# Patient Record
Sex: Male | Born: 1970 | Race: White | Hispanic: No | Marital: Married | Smoking: Former smoker
Health system: Southern US, Community
[De-identification: ages and names within clinical notes are randomized; demographics above are authoritative.]

## PROBLEM LIST (undated history)

## (undated) DIAGNOSIS — I10 Essential (primary) hypertension: Secondary | ICD-10-CM

## (undated) DIAGNOSIS — F329 Major depressive disorder, single episode, unspecified: Secondary | ICD-10-CM

## (undated) DIAGNOSIS — N200 Calculus of kidney: Secondary | ICD-10-CM

## (undated) DIAGNOSIS — F32A Depression, unspecified: Secondary | ICD-10-CM

## (undated) HISTORY — PX: THYROIDECTOMY, PARTIAL: SHX18

## (undated) HISTORY — PX: EYE SURGERY: SHX253

## (undated) HISTORY — DX: Depression, unspecified: F32.A

## (undated) HISTORY — DX: Major depressive disorder, single episode, unspecified: F32.9

---

## 2000-09-01 ENCOUNTER — Emergency Department (HOSPITAL_COMMUNITY): Admission: EM | Admit: 2000-09-01 | Discharge: 2000-09-01 | Payer: Self-pay | Admitting: Emergency Medicine

## 2000-09-01 ENCOUNTER — Encounter: Payer: Self-pay | Admitting: Emergency Medicine

## 2001-06-09 ENCOUNTER — Emergency Department (HOSPITAL_COMMUNITY): Admission: EM | Admit: 2001-06-09 | Discharge: 2001-06-09 | Payer: Self-pay | Admitting: *Deleted

## 2001-06-16 ENCOUNTER — Encounter: Payer: Self-pay | Admitting: Emergency Medicine

## 2001-06-16 ENCOUNTER — Emergency Department (HOSPITAL_COMMUNITY): Admission: EM | Admit: 2001-06-16 | Discharge: 2001-06-16 | Payer: Self-pay | Admitting: Emergency Medicine

## 2003-06-29 ENCOUNTER — Inpatient Hospital Stay (HOSPITAL_COMMUNITY): Admission: EM | Admit: 2003-06-29 | Discharge: 2003-06-30 | Payer: Self-pay | Admitting: Emergency Medicine

## 2003-06-29 ENCOUNTER — Encounter: Payer: Self-pay | Admitting: Cardiology

## 2005-09-24 ENCOUNTER — Emergency Department (HOSPITAL_COMMUNITY): Admission: EM | Admit: 2005-09-24 | Discharge: 2005-09-24 | Payer: Self-pay | Admitting: *Deleted

## 2007-02-22 ENCOUNTER — Emergency Department (HOSPITAL_COMMUNITY): Admission: EM | Admit: 2007-02-22 | Discharge: 2007-02-22 | Payer: Self-pay | Admitting: *Deleted

## 2010-01-19 ENCOUNTER — Emergency Department (HOSPITAL_COMMUNITY): Admission: EM | Admit: 2010-01-19 | Discharge: 2010-01-19 | Payer: Self-pay | Admitting: Emergency Medicine

## 2010-07-18 ENCOUNTER — Encounter: Admission: RE | Admit: 2010-07-18 | Discharge: 2010-07-18 | Payer: Self-pay | Admitting: Family Medicine

## 2011-01-07 LAB — CBC
HCT: 38.5 % — ABNORMAL LOW (ref 39.0–52.0)
Hemoglobin: 13.4 g/dL (ref 13.0–17.0)
MCHC: 34.7 g/dL (ref 30.0–36.0)
MCV: 91.8 fL (ref 78.0–100.0)
Platelets: 219 10*3/uL (ref 150–400)
RDW: 13.2 % (ref 11.5–15.5)

## 2011-01-07 LAB — DIFFERENTIAL
Basophils Absolute: 0 10*3/uL (ref 0.0–0.1)
Basophils Relative: 0 % (ref 0–1)
Eosinophils Absolute: 0.2 10*3/uL (ref 0.0–0.7)
Eosinophils Relative: 2 % (ref 0–5)
Monocytes Absolute: 0.5 10*3/uL (ref 0.1–1.0)

## 2011-01-07 LAB — PROTIME-INR: Prothrombin Time: 13.3 seconds (ref 11.6–15.2)

## 2011-03-06 NOTE — Discharge Summary (Signed)
NAME:  FOSTER, FRERICKS                          ACCOUNT NO.:  [id number]   MEDICAL RECORD NO.:  [id number]                   PATIENT TYPE:  INP   LOCATION:  4707                                 FACILITY:  [facility]   PHYSICIAN:  Learta Codding, M.D.                 DATE OF BIRTH:  1971/07/12   DATE OF ADMISSION:  06/29/2003  DATE OF DISCHARGE:                           DISCHARGE SUMMARY - REFERRING   ADMITTING PHYSICIAN:  Dr. Vernie Shanks. DeGent.   DISCHARGE PHYSICIAN:  Dr. Duke Salvia.   SUMMARY OF HISTORY:  Mr. Mckiver is a 40 year old white male who presents with  two days of chest discomfort.  He stated that while he was at work, on  June 27, 2003, he had chest pressure which he rated as a 6-7 on a scale  of 0-10, unassociated with any symptoms.  The discomfort only lasted 1-2  minutes and resolved only to reoccur later.  He went to see Dr. Andrey Campanile for  the discomfort, who referred the patient to the ER for further evaluation  and EKG changes.  He states the discomfort is similar to chest pain he has  had in the past which was diagnosed as a pulled muscle.   MEDICAL HISTORY:  Essentially unremarkable except that he smokes two packs  per day.   LABORATORY DATA:  Admission H&H were 14.5 and 42.3, normal indices,  platelets 184, WBCs 8.2.  Sodium 140, potassium 3.3, BUN 11, creatinine 0.8,  glucose 89.  Normal LFTs.  CK total, MB were negative for myocardial  infarction.  Fasting lipids showed a total cholesterol 125, triglycerides  198, HDL 35, LDL 50.  TSH was 2.788.  Urinalysis drug screen unremarkable.   EKG showed sinus bradycardia, normal sinus rhythm, early repolarization.   HOSPITAL COURSE:  Mr. Farruggia was admitted to the unit 4700.  Placed on  aspirin, Protonix and sublingual nitroglycerin paste.  Overnight he has not  had any further symptoms.  Enzymes and EKGs were negative for myocardial  infarction.   After review, Dr. Graciela Husbands felt that he could be discharged for  arrangements to  have an outpatient stress Cardiolite.  The patient was encouraged to  continue cardiac risk factor modification, especially to discontinue smoking  as both of his parents died secondary to lung cancer at an early age.  The  information to perform an outpatient stress Cardiolite has been faxed to our  office.   DISCHARGE DIAGNOSES:  Atypical chest discomfort.    DISPOSITION:  He is discharged home after we gave him aspirin 325 mg every  day.  Maintain a low salt-fat-cholesterol diet.  He is advised no smoking or  tobacco products.  Limit his beer intake to two beers per day or 2 ounces of  alcohol.  He was instructed that our office will call him at home to arrange  an outpatient stress Cardiolite and follow with the  PA.       Joellyn Rued, P.A. [facility]                    Learta Codding, M.D.    EW/MEDQ  D:  06/30/2003  T:  06/30/2003  Job:  [id number]   cc:   [place]ale [place]. Andrey Campanile, M.D.  [address]  [place]  [place] [address]  Fax: [phone]   Learta Codding, M.D.  [address]  [address]  [place]  [place] [address]

## 2011-03-06 NOTE — H&P (Signed)
NAME:  Clayton Bullock, HETTICH                          ACCOUNT NO.:  [id number]   MEDICAL RECORD NO.:  [id number]                   PATIENT TYPE:  EMS   LOCATION:  MAJO                                 FACILITY:  [facility]   PHYSICIAN:  Trudi Ida. Denton Lank, M.D.               DATE OF BIRTH:  [birth year]   DATE OF ADMISSION:  06/29/2003  DATE OF DISCHARGE:                                HISTORY & PHYSICAL   CHIEF COMPLAINT:  Chest pain.   HISTORY OF PRESENT ILLNESS:  Clayton Bullock is a 40 year old white male with a 23-  pack-year history of tobacco abuse and also a family history significant for  coronary artery disease but otherwise no significant medical history.  He is  now here with the complaint of left-sided chest pain x2 days.  The patient  states he was at work on June 27, 2003, when he noticed some left-sided  chest pain best characterized as a pressure sensation, rated a 6-7/10,  nonradiating, not accompanied with nausea, vomiting, or diaphoresis.  The  patient states that the pain was not too great and so did not try any  medications or seek out medical help.  His pain lasted one to two minutes,  resolved only to reoccur later.  After the pain did not resolve  spontaneously after 24 hours the patient went to his primary medical doctor,  Dr. Margrett Rud, who referred the patient to [facility] because  he was concerned about possible EKG changes that he interpreted to be  increased T-wave in the precordium as well as increased ST changes and  elevation in the precordium as well.  The patient states that he has had a  similar episode in the past when he had some right-sided chest pain that was  diagnosed as a pulled muscle secondary to work exertion.   PAST MEDICAL HISTORY:  The patient has never had a cardiac workup.  He  denies any cardiac stress test or catheterization.   ALLERGIES:  He states he has no known drug allergies.   CURRENT MEDICATIONS:  He does not take medications  regularly.   SOCIAL HISTORY:  He lives in [place] with his wife and two kids, ages 17 and  2.  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  He has  a 28-pack-year history (two packs per day x 14 years).  He states for his  alcohol use he drinks a six-pack every week to month.  Drugs:  He says no  current drug use although a history of marijuana use in the past.  He states  he does get daily exercise of push-ups.  As far as diet, he is currently  trying to gain weight because he has been losing weight due to the increased  exertion of his job.   The patient states that he does not take any over-the-counter medications or  herbal supplements.  FAMILY HISTORY:  The patient's mother is deceased.  Died at the age of 56  secondary to lung cancer and tobacco use.  Father deceased at 51 secondary  to lung cancer secondary to tobacco abuse.  The patient states he believes  his father also had an MI in his 60s.  The patient has four sisters, two  brothers.  He states one sister is now deceased.  She died at the age of 59,  congenital disease; however, he is not sure of the congenital disease.  His  remaining siblings are in good health.  No known coronary artery disease.   REVIEW OF SYSTEMS:  Positive for weakness x2 weeks and increased sleep.  Also positive for headaches.  Negative for shortness of breath.  No dyspnea  on exertion, no orthopnea.  No edema.  No palpitations.  No syncope.  The  patient denies any melena.  No odynophagia, dysphagia.  No gastroesophageal  reflux disease symptoms.  No changes in bowel habits.   PHYSICAL EXAMINATION:  VITAL SIGNS:  Temperature is 98.3, pulse is 63,  respiratory rate is 18, blood pressure is 135/45, O2 saturation is 100% on  room air.  GENERAL:  In no acute distress.  Well-developed, well-nourished male.  HEENT:  He is normocephalic, atraumatic.  Pupils equally round and reactive  to light and accommodation.  Sclerae clear.   Nares without discharge.  Moist  mucous membranes.  Oropharynx without erythema or exudate.  NECK:  Supple.  Without lymphadenopathy.  No JVD.  CARDIAC:  Shows regular rate and rhythm.  Normal S1, S2.  No murmur, rub, or  gallop.  With 2+ pulses x4.  LUNGS:  Clear to auscultation bilaterally.  Without wheezes, rales, or  rhonchi.  SKIN:  Shows no rashes or lesions.  ABDOMEN:  Positive bowel sounds.  Soft, nontender, nondistended.  With no  rebound, no guarding.  EXTREMITIES:  No clubbing, cyanosis, or edema.  MUSCULOSKELETAL:  No joint deformity  No joint effusions.  NEUROLOGIC:  Alert and oriented x3.  Cranial nerves II-XII are intact.  Strength is 5/5 x4.  Normal sensation throughout.   LABORATORY AND ACCESSORY DATA:  His EKG shows a rate of 59, rhythm is normal  sinus.  His axis is normal.  His intervals show a PR of 146, QRS of 88, QTC  of 388.  He has no Q-wave changes.  No evidence of ST elevation or  depression, just peak T-waves consistent with early repolarization, normal  in a male.  No hypertrophy.   His laboratories are pending.   ASSESSMENT AND PLAN:  This is a 40 year old white male with current tobacco  abuse, family history of coronary artery disease, here with the complaint of  atypical chest pain.  1. Chest pain:  Will admit the patient for rule out of myocardial     infarction.  Will check cardiac enzymes q.8h. x3.  Will also treat his     chest pain with nitroglycerin patch 1 to chest wall q.6h. and treat     chest pain with morphine 2-4 mg p.r.n. as well as acetaminophen.  Given     history of concurrent tobacco abuse and two parents deceased secondary to     lung cancer from tobacco abuse, will also check a chest x-ray PA and     lateral to rule out pulmonary cause of his chest pain.  In addition,     given his history of marijuana use in the past, will check a urine  drug     screen to rule out the possibility exogenous drugs causing the patient's    chest  pain as well.  2. Gastrointestinal prophylaxis:  Will treat the patient with daily     Protonix.  Also, given his history of gastroesophageal reflux, will give     the patient a GI cocktail to rule out the possibility of GI cause of the     patient's chest pain.  3. Deep vein thrombosis prophylaxis:  Will encourage ambulation to prevent     deep venous thrombosis formation given the patient's otherwise healthy     status.  4. Tobacco abuse:  Will refer the patient for tobacco cessation counseling,     given his current tobacco abuse and a family history of lung cancer     secondary to tobacco abuse.  5. Cardiac risk factors:  Will check a fasting lipid profile to rule out     hyperlipidemia and, if needed, will start a statin.  6. Disposition:  If cardiac enzymes are negative, will refer the patient for     stress Cardiolite in the a.m.      Foye Clock, MD                         Trudi Ida. Denton Lank, M.D.    JH/MEDQ  D:  06/29/2003  T:  06/30/2003  Job:  [id number]

## 2011-03-06 NOTE — H&P (Signed)
   NAME:  Clayton Bullock, Clayton Bullock                          ACCOUNT NO.:  [id number]   MEDICAL RECORD NO.:  [id number]                   PATIENT TYPE:  EMS   LOCATION:  MAJO                                 FACILITY:  [facility]   PHYSICIAN:  Foye Clock, MD                   DATE OF BIRTH:  11-07-1970   DATE OF ADMISSION:  06/29/2003  DATE OF DISCHARGE:                                HISTORY & PHYSICAL   CHIEF COMPLAINT:  Chest pain.   HISTORY OF PRESENT ILLNESS:  Clayton Bullock is a 40 year old white male with a  history of 28-pack-year history of tobacco abuse and family history  significant for coronary artery disease but no other significant medical  history, who is now presenting with the complaint of left-sided chest pain  x2 days.  The patient states he was at work on June 27, 2003, when he  had sudden onset of chest pressure.  He states it was a 6 or 7/10,  nonradiating.  He had no nausea, vomiting, or diaphoresis with this.  He  states that this pain was not too great and, therefore, did not try any  medications and did not seek medical help.  When the pain was occurring it  would last from one to two minutes and then resolve, only to reoccur later.  After the pain did not resolve spontaneously the patient sought attention  from his primary care physician, Dr. Margrett Rud.  There in the office  Dr. Andrey Campanile checked an EKG and was concerned about the possibility of  increased T-waves in the precordium and increased ST elevation in the  precordial leads and, therefore, referred the patient to [facility]  for evaluation from [facility].   PAST MEDICAL HISTORY:  Also significant for, the patient states, chest pain  on   Dictation ended at this point.                                                Foye Clock, MD    JH/MEDQ  D:  06/29/2003  T:  06/30/2003  Job:  [id number]

## 2011-04-22 ENCOUNTER — Emergency Department (HOSPITAL_BASED_OUTPATIENT_CLINIC_OR_DEPARTMENT_OTHER)
Admission: EM | Admit: 2011-04-22 | Discharge: 2011-04-23 | Disposition: A | Discharge status: Home / Self Care | Payer: Self-pay | Attending: Emergency Medicine | Admitting: Emergency Medicine

## 2011-04-22 DIAGNOSIS — F17200 Nicotine dependence, unspecified, uncomplicated: Secondary | ICD-10-CM | POA: Insufficient documentation

## 2011-04-22 DIAGNOSIS — K089 Disorder of teeth and supporting structures, unspecified: Secondary | ICD-10-CM | POA: Insufficient documentation

## 2011-08-20 ENCOUNTER — Encounter: Payer: Worker's Compensation | Attending: Physical Medicine & Rehabilitation

## 2011-08-20 ENCOUNTER — Ambulatory Visit: Payer: Self-pay | Admitting: Physical Medicine & Rehabilitation

## 2011-08-20 DIAGNOSIS — R209 Unspecified disturbances of skin sensation: Secondary | ICD-10-CM

## 2011-08-20 DIAGNOSIS — M25579 Pain in unspecified ankle and joints of unspecified foot: Secondary | ICD-10-CM

## 2011-08-24 NOTE — Consult Note (Addendum)
This is a physical medicine rehabilitation second opinion and further evaluation of left foot pain.  Information obtained from the patient as well as records from [facility], [facility], [facility] Dr. Tobin Chad office, [facility] Orthopedic Department, Dr. Lucita Ferrara.  HISTORY:  A 40 year old male who was at work when a fork lift drove onto his left foot on November 03, 2010.  He could not tell me exactly how long it has remained there before came off, however, states that he gets significant swelling right afterwards.  He was seen by Dr. Sherlean Foot, x-rays were negative at that time.  He was sent to physical therapy, which he completed.  He was seen in 2nd opinion consultation by Dr. Victorino Dike from [facility].  X-rays were negative.  Plateaued in physical therapy, underwent FCE, showing the patient to be able to perform at a heavy physical demand level.  He tried going back to work, however, states that after about 2 weeks he has exacerbation of his foot pain. He was seen in for an IME by Dr. Lucita Ferrara at [facility] Department of Orthoped[facility], x-rays were negative.  MRI show of the left foot showed mild lateral cuneiform marrow edema, likely stress reaction change, otherwise negative also left ankle inframalleolar pronator brevis tendinosis or partial tear.  He indicates his pain is 5-6/10, up to 7 or 8 with activity.  Currently not working.  He was placed on 10-pound lifting restrictions sedentary duty by [employer].  CURRENT PAIN MEDS: 1. Ibuprofen 800 mg 1 tablet b.i.d. or t.i.d. p.r.n. 2. Tramadol 50 mg 1-2 p.o. q.6-8 hours p.r.n.  PAST MEDICAL HISTORY:  Otherwise unremarkable.  SOCIAL HISTORY:  Single.  FAMILY HISTORY:  Heart disease, diabetes.  ALLERGIES:  None known.  PHYSICAL EXAMINATION:  GENERAL:  No acute distress.  Mood and affect appropriate. VITAL SIGNS:  Blood pressure 137/58, pulse 64, respirations 16, and  O2 saturation 98% on room air. EXTREMITIES:  Upper extremity strength is normal.  Upper extremity range of motion is normal.  Neck, thoracic spine, and lumbar spine; no tenderness to palpation and normal range of motion.  Lower extremity strength is normal in hip flexion, knee extension, ankle dorsiflexor, EHL, foot eversion and inversion.  He has no evidence of ligamentous instability on mediolateral stress and negative anterior drawer at the ankle.  Thompson sign is negative.  No tenderness over the Achilles tendon.  He has minimal tenderness over the base of the 5th metatarsal on the left side only.  Sensation mildly reduced over the lateral foot on the left side only not over the little toe.  No change in sensation over the dorsal aspect of the foot.  Toe extensor strength is normal.  Foot intrinsic muscles show no evidence of atrophy on the left side compared to the right side. There is no skin color changes.  His temperature was 81 degrees on the right side and 81.9 on the left side in Fahrenheit.  Pulses, dorsalis pedis and posterior tibial are normal.  Skin color is normal.  Nail beds are normal, does have some fungal changes on the great toe bilaterally.  IMPRESSION: 1. Left foot pain.  Differential diagnosis includes left peroneal     brevis tendinosis which was apparent on MRI as well as left sural     neuropathy 2. I do not think there is any sign of superficial peroneal neuropathy     on my clinical examination, although there is some nerve overlap.  RECOMMENDATIONS: 1. In orders exclude nerve injury would like to do the EMG and NCV     particularly focusing on the sural and peroneal nerves, would do     side-to-side comparison. 2. If EMG positive for sural neuropathy and/or peroneal neuropathy can     do a guided injections of corticosteroid and Marcaine.  If EMG     negative, we would consider foot orthosis and additional assessment     by Dr. Darrick Penna from [facility], who specializes in the running     injuries and get his opinion 3. I do not think there is any need for narcotic analgesics.  At this     point, would continue Dr. Roby Lofts, work restrictions but if EMG and     NCV negative would go by [employer] report.  With additional restriction     of rest sitting for 20 minutes every 2 hours pending orthotics     evaluation that with case manager as well as in the presence of the     patient to discuss mainly the neuropathy findings, I was able to     review additional MRI and FCE after that sit down face-to-face.     Erick Colace, M.D. Electronically Signed    AEK/MedQ D:08/20/2011 12:25:31  T:08/20/2011 21:50:46  Job #:  [id number]  cc:   Mila Homer. Sherlean Foot, M.D. Fax: [phone]  Dr. Haynes Dage  Dr. Neville Route fax [phone]

## 2011-08-27 ENCOUNTER — Ambulatory Visit: Payer: Worker's Compensation | Admitting: Physical Medicine & Rehabilitation

## 2011-08-27 ENCOUNTER — Encounter: Payer: Worker's Compensation | Attending: Physical Medicine & Rehabilitation

## 2011-08-27 DIAGNOSIS — R209 Unspecified disturbances of skin sensation: Secondary | ICD-10-CM

## 2011-08-27 DIAGNOSIS — M79609 Pain in unspecified limb: Secondary | ICD-10-CM | POA: Insufficient documentation

## 2011-08-27 DIAGNOSIS — G5780 Other specified mononeuropathies of unspecified lower limb: Secondary | ICD-10-CM | POA: Insufficient documentation

## 2011-08-28 ENCOUNTER — Ambulatory Visit: Payer: Self-pay

## 2011-09-07 ENCOUNTER — Ambulatory Visit: Payer: Worker's Compensation | Admitting: Physical Medicine & Rehabilitation

## 2011-09-07 DIAGNOSIS — G8921 Chronic pain due to trauma: Secondary | ICD-10-CM

## 2011-09-07 DIAGNOSIS — R209 Unspecified disturbances of skin sensation: Secondary | ICD-10-CM

## 2011-09-07 DIAGNOSIS — M7750 Other enthesopathy of unspecified foot: Secondary | ICD-10-CM

## 2011-09-07 NOTE — Assessment & Plan Note (Signed)
HISTORY:  A 40 year old male who was at work when a forklift drove onto the left foot on November 03, 2010, no fracture.  He has undergone Orthopedic evaluation by 3 surgeries, did not feel like he sustained any significant injury other than perhaps a nerve injury.  His MRI has showed a left ankle pronator brevis tendinosis versus partial tear.  He has left lateral foot pain, some numbness over an isolated area.  He underwent electrodiagnostic evaluation on August 27, 2011, his left sural nerve amplitude was at about 50% less than the right sural nerve amplitude otherwise no significant differences in the superficial peroneal or in the peroneal motor.  Needle EMG of the EDB as well as ADQ were normal.  Previous MRI as noted above.  Previous FCE demonstrating okay for heavy duty work.  The patient has tried going back to work, states that his foot pain hurts when he was walking for prolonged period of time.  He has had a orthostasis made already.  The pain level is 6/10.  Described as sharp, dull, stabbing, interferes activity at a 7/10 level.  Relief from meds is little.  Pain is worse with walking and standing.  SOCIAL HISTORY:  Single, nonsmoker, nondrinker.  PHYSICAL EXAMINATION:  VITAL SIGNS:  Blood pressure 153/81, pulse 82, respirations 16, O2 saturation 98% on room air, height 5 feet 7 inches, and 64 pounds. GENERAL:  No acute distress.  Mood and affect appropriate. EXTREMITIES:  His left foot has a small patch of numbness to pinprick over the lateral foot between the metatarsal head and the base of the metatarsal.  No other areas with reduced sensation.  He has good skin color.  Good warmth and good pulses.  His lower extremity strength is normal.  He does have some mild tenderness palpation base of the fifth metatarsal.  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  He will be scheduled for sural nerve block.  He will be started on some gabapentin 100 mg working up to q.i.d.  In terms of return to work, I think we will give in 2 weeks to gradually build up his standing tolerance to 12 hours per day and then let him go back to full duty.  We will do this sural nerve block prior to.  Discussed with the patient and case Production designer, theatre/television/film.  Answered questions.     Erick Colace, M.D. Electronically Signed   AEK/MedQ D:  09/07/2011 10:24:58  T:  09/07/2011 11:34:05  Job #:  [id number]  cc:   Sharin Mons [phone]  Dr. Sherlean Foot

## 2011-09-08 ENCOUNTER — Encounter: Payer: Worker's Compensation | Admitting: Physical Medicine & Rehabilitation

## 2011-09-08 DIAGNOSIS — M25579 Pain in unspecified ankle and joints of unspecified foot: Secondary | ICD-10-CM

## 2011-09-08 DIAGNOSIS — R209 Unspecified disturbances of skin sensation: Secondary | ICD-10-CM

## 2011-09-08 NOTE — Procedures (Signed)
NAME:  Clayton Bullock, Clayton Bullock NO.:  [id number]  MEDICAL RECORD NO.:  [id number]           PATIENT TYPE:  O  LOCATION:  TPC                          FACILITY:  [facility]  PHYSICIAN:  Erick Colace, M.D.DATE OF BIRTH:  1971-09-12  DATE OF PROCEDURE:  09/08/2011 DATE OF DISCHARGE:                              OPERATIVE REPORT  PROCEDURE:  Right sural nerve block.  INDICATION:  Sural nerve mediated pain due to left foot injury in January 2012.  The patient placed in a prone position.  After informed consent was obtained explaining the possibility of bleeding, bruising, infection as well as failure to provide improvement or having a very temporary effect. The patient will set up for sural nerve conduction, sural nerve response was obtained.  Areas marked and prepped.  Then a 30-gauge 1 inch silicone coated needle electrode was inserted.  The peripheral nerve stimulator was utilized.  Paresthesias were elicited in the sural nerve distribution, followed by injection of 1 mL of 40 mg/mL Depo-Medrol plus 4 mL of 0.5% Marcaine solution.  Total of 4 mL of this solution was injected.  The patient tolerated procedure well.  Postprocedure instructions given.  He has a followup appointment with me in 10-14 days.  He is out of work for now.     Erick Colace, M.D. Electronically Signed    AEK/MEDQ  D:  09/08/2011 13:54:10  T:  09/08/2011 20:42:28  Job:  [id number]  cc:   Roylene Reason [phone]

## 2011-09-21 ENCOUNTER — Encounter: Payer: Worker's Compensation | Attending: Physical Medicine & Rehabilitation

## 2011-09-21 ENCOUNTER — Ambulatory Visit: Payer: Worker's Compensation | Admitting: Physical Medicine & Rehabilitation

## 2011-09-21 DIAGNOSIS — G5780 Other specified mononeuropathies of unspecified lower limb: Secondary | ICD-10-CM | POA: Insufficient documentation

## 2011-09-21 DIAGNOSIS — M7750 Other enthesopathy of unspecified foot: Secondary | ICD-10-CM

## 2011-09-21 DIAGNOSIS — R209 Unspecified disturbances of skin sensation: Secondary | ICD-10-CM | POA: Insufficient documentation

## 2011-09-21 DIAGNOSIS — M79609 Pain in unspecified limb: Secondary | ICD-10-CM | POA: Insufficient documentation

## 2011-09-21 NOTE — Assessment & Plan Note (Signed)
REASON FOR VISIT:  Left foot pain.  HISTORY:  A 40 year old male had an injury to his left foot on November 03, 2010 when a forklift roll onto his foot.  He had negative x-rays at that time, has gone through physical therapy after seeing Orthopedics. He has had another Orthopedic consultation.  Once again x-rays were negative.  He had third Orthopedic consultation, IME, x-rays again negative.  MRI showed mild lateral cuneiform marrow edema, likely stress reaction change.  Otherwise negative except for some pronator brevis tendinosis versus partial tear.  Was not felt to need any type of Orthopedic surgery.  He has been maintained on ibuprofen 800 b.i.d. or t.i.d., p.r.n. tramadol 50 mg 1-2 q.6h 8 hours p.r.n.  Past history otherwise unremarkable.  He underwent EMG at this office which showed approximately 50% difference between the right and left side in terms of sural nerve amplitude, remainder exam was normal.  He underwent a nerve stimulation- guided sural nerve injection with Marcaine and corticosteroid which was not helpful at all for his pain.  PHYSICAL EXAMINATION:  GENERAL:  No acute distress.  Mood and affect appropriate.  He ambulates without any gait abnormalities. MUSCULOSKELETAL:  He has a small patch of numbness on the lateral aspect of his left foot between the proximal and distal at the midpoint of the left fifth metatarsal.  He also has tenderness at the proximal lateral portion of the foot at the base of the first metatarsal.  His pain does increase with eversion of the foot.  There is no skin temperature differences, no hypersensitivity to touch in either foot, good pulses in both feet and normal skin.  IMPRESSION: 1. Left lateral foot pain, I do not think this is a nerve-related     pain.  Therefore, I will stop the gabapentin instead but this is     more likely due to a tendinosis.  He has pain with the eversion     correlating with some MRI finding of peroneal  tendinopathy.  He     already has a foot orthosis, I really do not think there is any     other specific treatment.  Orthopedics does not feel any type of     surgical intervention is necessary. 2. I therefore feel the patient is at maximal medical improvement.  He     has already had an FCE showing him capable of going back to work at     a heavy demand level.  However, I will ask him to continue     increasing his level of activity over the next week on his own     prior to resuming work on December 9.  He has been doing activities     such as raking his leaves about an hour and quarter, was tolerated     before he has to rest and sit down to about the foot and calm down.     I have indicated he can ice his foot in addition to his medications     which include the tramadol and ibuprofen listed above.  He will     follow up in our mid-level clinic in six months.  I really do not     think this is going to result in any partial or permanent     disability.  I do think he will need to continue the medications     just for control of pain management standpoint and certainly not a  narcotic analgesic candidate based on the mild pathology noted.     Erick Colace, M.D. Electronically Signed    AEK/MedQ D:  09/21/2011 12:45:44  T:  09/21/2011 13:52:50  Job #:  [id number]

## 2011-09-22 NOTE — Assessment & Plan Note (Signed)
1. I discussed my treatment and recommendations which is basically     continue the current medications.  We also discussed that no     further diagnostics need at this point. 2. I do feel like he has reached maximal medical improvement. 3. Follow up in with my nurse practitioner in 6 months. 4. Resume work on September 27, 2011 with no restrictions.     Erick Colace, M.D. Electronically Signed    AEK/MedQ D:  09/21/2011 12:46:56  T:  09/21/2011 13:03:14  Job #:  [id number]  cc:   Sharin Mons fax [phone]

## 2011-09-25 ENCOUNTER — Ambulatory Visit: Payer: Self-pay | Admitting: Physical Medicine & Rehabilitation

## 2011-10-01 ENCOUNTER — Encounter: Payer: Self-pay | Admitting: Physical Medicine & Rehabilitation

## 2011-10-05 ENCOUNTER — Ambulatory Visit: Payer: Self-pay | Admitting: Physical Medicine & Rehabilitation

## 2011-10-20 ENCOUNTER — Emergency Department (HOSPITAL_BASED_OUTPATIENT_CLINIC_OR_DEPARTMENT_OTHER)
Admission: EM | Admit: 2011-10-20 | Discharge: 2011-10-20 | Disposition: A | Discharge status: Home / Self Care | Payer: PRIVATE HEALTH INSURANCE | Attending: Emergency Medicine | Admitting: Emergency Medicine

## 2011-10-20 ENCOUNTER — Encounter: Payer: Self-pay | Admitting: *Deleted

## 2011-10-20 DIAGNOSIS — J310 Chronic rhinitis: Secondary | ICD-10-CM | POA: Insufficient documentation

## 2011-10-20 DIAGNOSIS — Z79899 Other long term (current) drug therapy: Secondary | ICD-10-CM | POA: Insufficient documentation

## 2011-10-20 DIAGNOSIS — R51 Headache: Secondary | ICD-10-CM | POA: Insufficient documentation

## 2011-10-20 MED ORDER — OXYMETAZOLINE HCL 0.05 % NA SOLN
1.0000 | Freq: Once | NASAL | Status: AC
  Administered 2011-10-20: 1 via NASAL
  Filled 2011-10-20: qty 15

## 2011-10-20 MED ORDER — NAPROXEN 500 MG PO TABS: 500.0000 mg | ORAL_TABLET | Freq: Two times a day (BID) | ORAL | Status: DC

## 2011-10-20 MED ORDER — CETIRIZINE HCL 10 MG PO TABS: 10.0000 mg | ORAL_TABLET | Freq: Every day | ORAL | Status: DC

## 2011-10-20 MED ORDER — KETOROLAC TROMETHAMINE 60 MG/2ML IM SOLN
60.0000 mg | Freq: Once | INTRAMUSCULAR | Status: AC
  Administered 2011-10-20: 60 mg via INTRAMUSCULAR
  Filled 2011-10-20: qty 2

## 2011-10-20 NOTE — ED Provider Notes (Signed)
History     CSN: [id number]  Arrival date & time 10/20/11  4098   First MD Initiated Contact with Patient 10/20/11 1901      Chief Complaint  Patient presents with  . Nasal Congestion  . Headache    (Consider location/radiation/quality/duration/timing/severity/associated sxs/prior treatment) HPI Comments: 41 year old male with a history of headache that started approximately 5 days ago. He was seen by his physician and told that he had sinusitis, started on Augmentin as well as Sudafed with good improvement. Today he felt like the pain came back to his head and pressure in his sinuses. He took ibuprofen prior to arrival with some improvement and currently complains of pain that is 4/10. The pain is throbbing and is not associated with fevers chills nausea vomiting or sinus drainage. He denies changes in his vision, weakness, numbness  Patient is a 41 y.o. male presenting with headaches. The history is provided by the patient.  Headache     History reviewed. No pertinent past medical history.  History reviewed. No pertinent past surgical history.  History reviewed. No pertinent family history.  History  Substance Use Topics  . Smoking status: Never Smoker   . Smokeless tobacco: Not on file  . Alcohol Use: No      Review of Systems  Neurological: Positive for headaches.  All other systems reviewed and are negative.    Allergies  Review of patient's allergies indicates no known allergies.  Home Medications   Current Outpatient Rx  Name Route Sig Dispense Refill  . AMOXICILLIN-POT CLAVULANATE 875-125 MG PO TABS Oral Take 1 tablet by mouth 2 (two) times daily.      Marland Kitchen BEE POLLEN PO Oral Take 1 capsule by mouth daily.      Marland Kitchen DM-PHENYLEPHRINE-ACETAMINOPHEN 10-5-325 MG PO CAPS Oral Take 2 capsules by mouth once as needed. For headache      . IBUPROFEN 800 MG PO TABS Oral Take 800 mg by mouth every 8 (eight) hours as needed. For foot pain     . ADULT MULTIVITAMIN  W/MINERALS CH Oral Take 1 tablet by mouth daily.      . TESTOSTERONE 20.25 MG/1.25GM (1.62%) TD GEL Transdermal Place 2 application onto the skin daily.      . TRAMADOL HCL 50 MG PO TABS Oral Take 50 mg by mouth every 6 (six) hours as needed. For pain. Maximum dose= 8 tablets per day     . CETIRIZINE HCL 10 MG PO TABS Oral Take 1 tablet (10 mg total) by mouth daily. 30 tablet 1  . NAPROXEN 500 MG PO TABS Oral Take 1 tablet (500 mg total) by mouth 2 (two) times daily with a meal. 30 tablet 0    BP 139/82  Pulse 84  Temp(Src) 98.2 F (36.8 C) (Oral)  Resp 16  Ht 5\' 7"  (1.702 m)  Wt 162 lb (73.483 kg)  BMI 25.37 kg/m2  SpO2 98%  Physical Exam  Nursing note and vitals reviewed. Constitutional: He appears well-developed and well-nourished. No distress.  HENT:  Head: Normocephalic and atraumatic.  Mouth/Throat: Oropharynx is clear and moist. No oropharyngeal exudate.  Eyes: Conjunctivae and EOM are normal. Pupils are equal, round, and reactive to light. Right eye exhibits no discharge. Left eye exhibits no discharge. No scleral icterus.  Neck: Normal range of motion. Neck supple. No JVD present. No thyromegaly present.  Cardiovascular: Normal rate, regular rhythm, normal heart sounds and intact distal pulses.  Exam reveals no gallop and no friction rub.  No murmur heard. Pulmonary/Chest: Effort normal and breath sounds normal. No respiratory distress. He has no wheezes. He has no rales.  Abdominal: Soft. Bowel sounds are normal. He exhibits no distension and no mass. There is no tenderness.  Musculoskeletal: Normal range of motion. He exhibits no edema and no tenderness.  Lymphadenopathy:    He has no cervical adenopathy.  Neurological: He is alert. Coordination normal.       Neurologic exam:  Speech clear, pupils equal round reactive to light, extraocular movements intact  Normal peripheral visual fields Cranial nerves III through XII normal including no facial droop Follows  commands, moves all extremities x4, normal strength to bilateral upper and lower extremities at all major muscle groups including grip Sensation normal to light touch and pinprick Coordination intact, no limb ataxia, finger-nose-finger normal Rapid alternating movements normal No pronator drift Gait normal   Skin: Skin is warm and dry. No rash noted. No erythema.  Psychiatric: He has a normal mood and affect. His behavior is normal.    ED Course  Procedures (including critical care time)  Labs Reviewed - No data to display No results found.   1. Sinus headache   2. Rhinitis       MDM  Overall patient appears well but has some nasal congestion and sinus pressure. I have given Afrin, Toradol in the emergency department, discharged with Zyrtec and Naprosyn and Afrin. Have encouraged finishing his Augmentin and following up with primary physician. He has no focal neurologic deficits, no fever, no stiff neck.  Discharge prescriptions #1 Afrin #2 Zyrtec #3 Naprosyn         Vida Roller, MD 10/20/11 Corky Crafts

## 2011-10-20 NOTE — ED Notes (Signed)
Pt c/o h/a and head congestion, seen by PMD on thurs placed on augmentin w/o relief

## 2011-10-29 ENCOUNTER — Encounter (HOSPITAL_BASED_OUTPATIENT_CLINIC_OR_DEPARTMENT_OTHER): Payer: Self-pay | Admitting: *Deleted

## 2011-10-29 ENCOUNTER — Emergency Department (HOSPITAL_BASED_OUTPATIENT_CLINIC_OR_DEPARTMENT_OTHER)
Admission: EM | Admit: 2011-10-29 | Discharge: 2011-10-29 | Disposition: A | Discharge status: Home / Self Care | Payer: PRIVATE HEALTH INSURANCE | Attending: Emergency Medicine | Admitting: Emergency Medicine

## 2011-10-29 DIAGNOSIS — R0981 Nasal congestion: Secondary | ICD-10-CM

## 2011-10-29 DIAGNOSIS — J3489 Other specified disorders of nose and nasal sinuses: Secondary | ICD-10-CM | POA: Insufficient documentation

## 2011-10-29 DIAGNOSIS — Z79899 Other long term (current) drug therapy: Secondary | ICD-10-CM | POA: Insufficient documentation

## 2011-10-29 DIAGNOSIS — R51 Headache: Secondary | ICD-10-CM | POA: Insufficient documentation

## 2011-10-29 MED ORDER — KETOROLAC TROMETHAMINE 30 MG/ML IJ SOLN
60.0000 mg | Freq: Once | INTRAMUSCULAR | Status: AC
  Administered 2011-10-29: 60 mg via INTRAMUSCULAR
  Filled 2011-10-29: qty 2

## 2011-10-29 NOTE — ED Provider Notes (Signed)
History     CSN: [id number]  Arrival date & time 10/29/11  1955   First MD Initiated Contact with Patient 10/29/11 2020      Chief Complaint  Patient presents with  . Nasal Congestion  . Headache    (Consider location/radiation/quality/duration/timing/severity/associated sxs/prior treatment) HPI Comments: Pt states that this has been going on for several months and the congestion and headache keep returning:Pt states that he has been on and off antibiotics multiple times:pt states that he was seen here on 1/1 and the symptoms were better with the medication given here, but not they are returning  Patient is a 41 y.o. male presenting with headaches. The history is provided by the patient. No language interpreter was used.  Headache  This is a recurrent problem. The current episode started more than 1 week ago. The problem occurs constantly. The problem has not changed since onset.Associated with: nasal congestion. The pain is located in the frontal region. The quality of the pain is described as throbbing. The pain is mild. The pain does not radiate. Pertinent negatives include no fever, no palpitations, no shortness of breath and no nausea.    History reviewed. No pertinent past medical history.  History reviewed. No pertinent past surgical history.  History reviewed. No pertinent family history.  History  Substance Use Topics  . Smoking status: Never Smoker   . Smokeless tobacco: Not on file  . Alcohol Use: No      Review of Systems  Constitutional: Negative for fever.  Respiratory: Negative for shortness of breath.   Cardiovascular: Negative for palpitations.  Gastrointestinal: Negative for nausea.  Neurological: Positive for headaches.  All other systems reviewed and are negative.    Allergies  Review of patient's allergies indicates no known allergies.  Home Medications   Current Outpatient Rx  Name Route Sig Dispense Refill  . AMOXICILLIN-POT CLAVULANATE  875-125 MG PO TABS Oral Take 1 tablet by mouth 2 (two) times daily.      Marland Kitchen BEE POLLEN PO Oral Take 1 capsule by mouth daily.      Marland Kitchen CETIRIZINE HCL 10 MG PO TABS Oral Take 1 tablet (10 mg total) by mouth daily. 30 tablet 1  . DM-PHENYLEPHRINE-ACETAMINOPHEN 10-5-325 MG PO CAPS Oral Take 2 capsules by mouth once as needed. For headache      . IBUPROFEN 800 MG PO TABS Oral Take 800 mg by mouth every 8 (eight) hours as needed. For foot pain     . ADULT MULTIVITAMIN W/MINERALS CH Oral Take 1 tablet by mouth daily.      Marland Kitchen NAPROXEN 500 MG PO TABS Oral Take 1 tablet (500 mg total) by mouth 2 (two) times daily with a meal. 30 tablet 0  . TESTOSTERONE 20.25 MG/1.25GM (1.62%) TD GEL Transdermal Place 2 application onto the skin daily.      . TRAMADOL HCL 50 MG PO TABS Oral Take 50 mg by mouth every 6 (six) hours as needed. For pain. Maximum dose= 8 tablets per day       BP 146/69  Pulse 98  Temp(Src) 98.9 F (37.2 C) (Oral)  Resp 16  SpO2 100%  Physical Exam  Nursing note and vitals reviewed. Constitutional: He is oriented to person, place, and time. He appears well-developed and well-nourished.  HENT:  Head: Normocephalic and atraumatic.  Right Ear: External ear normal.  Left Ear: External ear normal.  Nose: Rhinorrhea present.  Cardiovascular: Normal rate and regular rhythm.   Pulmonary/Chest: Effort normal and breath  sounds normal.  Musculoskeletal: Normal range of motion.  Neurological: He is alert and oriented to person, place, and time.  Skin: Skin is warm and dry.  Psychiatric: He has a normal mood and affect.    ED Course  Procedures (including critical care time)  Labs Reviewed - No data to display No results found.   1. Headache   2. Nasal congestion       MDM  Don't think antibiotics are need at this time:with pt having recurrent symptoms over the last 3 months, think pt should follow up with ent        Teressa Lower, NP 10/29/11 2053

## 2011-10-29 NOTE — ED Notes (Signed)
Pt states that he has nasal congestion and sinus HA states that he has had similar sx and has been treated for sinus infection with abx pt is currently still on abx

## 2011-10-29 NOTE — ED Provider Notes (Signed)
Medical screening examination/treatment/procedure(s) were performed by non-physician practitioner and as supervising physician I was immediately available for consultation/collaboration.   Dayton Bailiff, MD 10/29/11 2140

## 2011-12-18 ENCOUNTER — Other Ambulatory Visit: Payer: Self-pay | Admitting: Physical Medicine & Rehabilitation

## 2011-12-24 ENCOUNTER — Other Ambulatory Visit: Payer: Self-pay | Admitting: Physical Medicine & Rehabilitation

## 2011-12-31 ENCOUNTER — Encounter: Payer: Self-pay | Admitting: Physical Medicine & Rehabilitation

## 2011-12-31 ENCOUNTER — Ambulatory Visit (HOSPITAL_BASED_OUTPATIENT_CLINIC_OR_DEPARTMENT_OTHER): Payer: Worker's Compensation | Admitting: Physical Medicine & Rehabilitation

## 2011-12-31 ENCOUNTER — Encounter: Payer: Worker's Compensation | Attending: Physical Medicine & Rehabilitation

## 2011-12-31 VITALS — BP 150/88 | HR 79 | Resp 16 | Ht 67.0 in | Wt 160.0 lb

## 2011-12-31 DIAGNOSIS — R209 Unspecified disturbances of skin sensation: Secondary | ICD-10-CM | POA: Insufficient documentation

## 2011-12-31 DIAGNOSIS — G8921 Chronic pain due to trauma: Secondary | ICD-10-CM

## 2011-12-31 DIAGNOSIS — G5780 Other specified mononeuropathies of unspecified lower limb: Secondary | ICD-10-CM | POA: Insufficient documentation

## 2011-12-31 DIAGNOSIS — M79609 Pain in unspecified limb: Secondary | ICD-10-CM | POA: Insufficient documentation

## 2011-12-31 NOTE — Patient Instructions (Signed)
Normal exam.  No no issues identified.  Cont current meds.

## 2011-12-31 NOTE — Progress Notes (Signed)
  Subjective:    Patient ID: GULED GAHAN, male    DOB: [birth year], 41 y.o.   MRN: [id number]  HPI HISTORY: A 41 year old male had an injury to his left foot on November 03, 2010 when a forklift roll onto his foot. He had negative x-rays at  that time, has gone through physical therapy after seeing Orthopedics.  He has had another Orthopedic consultation. Once again x-rays were  negative. He had third Orthopedic consultation, IME, x-rays again  negative. MRI showed mild lateral cuneiform marrow edema, likely stress  reaction change. Otherwise negative except for some pronator brevis  tendinosis versus partial tear. Was not felt to need any type of  Orthopedic surgery. He has been maintained on ibuprofen 800 b.i.d. or  t.i.d., p.r.n. tramadol 50 mg 1-2 q.6h 8 hours p.r.n. Past history  otherwise unremarkable.  Pain Inventory Average Pain 8 Pain Right Now 8 My pain is sharp and stabbing  In the last 24 hours, has pain interfered with the following? General activity 8 Relation with others 8 Enjoyment of life 8 What TIME of day is your pain at its worst? All Day Sleep (in general) Poor  Pain is worse with: walking, bending, standing, unsure and some activites Pain improves with: rest, heat/ice and medication Relief from Meds: 2  Mobility walk without assistance how many minutes can you walk? 10 ability to climb steps?  yes do you drive?  yes  Function employed # of hrs/week 36-44  Neuro/Psych weakness trouble walking depression  Prior Studies Any changes since last visit?  no  Physicians involved in your care Any changes since last visit?  no  Review of Systems  HENT: Negative.   Eyes: Negative.   Respiratory: Negative.   Gastrointestinal: Negative.   Genitourinary: Negative.   Musculoskeletal: Negative.   Skin: Negative.   Neurological: Positive for weakness.  Hematological: Negative.   Psychiatric/Behavioral: Negative.        Objective:   Physical  Exam  Musculoskeletal:       Left ankle: He exhibits no swelling, no ecchymosis, no deformity and normal pulse. no tenderness. No AITFL, no head of 5th metatarsal and no proximal fibula tenderness found. Achilles tendon normal.       Normal ROM 35 degrees DF 40 deg plantar flexion          Assessment & Plan:  1. History of peroneal tendinitis but no longer has any objective findings.. 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 No new injury detected. Exam today is normal. No imaging studies recommended. Continue current medications. Followup in 6 months with PA.  Discussed with case manager Sharin Mons patient in room

## 2012-01-07 ENCOUNTER — Encounter (HOSPITAL_BASED_OUTPATIENT_CLINIC_OR_DEPARTMENT_OTHER): Payer: Self-pay | Admitting: *Deleted

## 2012-01-07 ENCOUNTER — Emergency Department (HOSPITAL_BASED_OUTPATIENT_CLINIC_OR_DEPARTMENT_OTHER)
Admission: EM | Admit: 2012-01-07 | Discharge: 2012-01-07 | Disposition: A | Discharge status: Home / Self Care | Payer: PRIVATE HEALTH INSURANCE | Attending: Emergency Medicine | Admitting: Emergency Medicine

## 2012-01-07 DIAGNOSIS — F3289 Other specified depressive episodes: Secondary | ICD-10-CM | POA: Insufficient documentation

## 2012-01-07 DIAGNOSIS — R51 Headache: Secondary | ICD-10-CM

## 2012-01-07 DIAGNOSIS — I10 Essential (primary) hypertension: Secondary | ICD-10-CM

## 2012-01-07 DIAGNOSIS — F329 Major depressive disorder, single episode, unspecified: Secondary | ICD-10-CM | POA: Insufficient documentation

## 2012-01-07 MED ORDER — METOCLOPRAMIDE HCL 5 MG/ML IJ SOLN
10.0000 mg | Freq: Once | INTRAMUSCULAR | Status: AC
Start: 2012-01-07 — End: 2012-01-07
  Administered 2012-01-07: 10 mg via INTRAMUSCULAR

## 2012-01-07 NOTE — ED Provider Notes (Signed)
History     CSN: [id number]  Arrival date & time 01/07/12  1533   First MD Initiated Contact with Patient 01/07/12 1738      Chief Complaint  Patient presents with  . Headache    (Consider location/radiation/quality/duration/timing/severity/associated sxs/prior treatment) HPI History provided by pt.   Pt has had an intermittent, diffuse, pressure-like/throbbing headache since 1pm today.  No modifying factors.  No associated fever, nasal congestion/rhinorrrhea, blurred vision, dizziness, N/V, extremity weakness/paresthesias.  Has had similar headaches of higher intensity in the past that he had attributes to sinusitis.  No recent head trauma.  Pt is concerned that headache could be related to HTN.  BP was 148/104 at pharmacy this morning.  No prior history.  Also reports that he slept for almost 24 hours after coming off of night shift yesterday which is unusual for him.  He has not otherwise been feeling fatigued.  Has been feeling somewhat depressed recently but started a new anti-depressant 2 weeks ago.    Past Medical History  Diagnosis Date  . Depression     Past Surgical History  Procedure Date  . Eye surgery     Family History  Problem Relation Age of Onset  . Cancer Mother   . Cancer Father   . Diabetes Father   . Heart disease Father     History  Substance Use Topics  . Smoking status: Former Games developer  . Smokeless tobacco: Never Used  . Alcohol Use: No      Review of Systems  All other systems reviewed and are negative.    Allergies  Review of patient's allergies indicates no known allergies.  Home Medications   Current Outpatient Rx  Name Route Sig Dispense Refill  . BEE POLLEN PO Oral Take 1 capsule by mouth daily.      Marland Kitchen FEXOFENADINE HCL 180 MG PO TABS Oral Take 180 mg by mouth daily.    . IBUPROFEN 800 MG PO TABS Oral Take 800 mg by mouth every 8 (eight) hours as needed. Patient takes this medication for his foot pain.    . ADULT MULTIVITAMIN  W/MINERALS CH Oral Take 1 tablet by mouth daily.    . TESTOSTERONE 20.25 MG/1.25GM (1.62%) TD GEL Transdermal Place 2 application onto the skin daily.      . TRAMADOL HCL 50 MG PO TABS  TAKE 1 TO 2 TABLETS BY MOUTH EVERY 6 TO 8 HOURS AS NEEDED FOR PAIN 60 tablet 2    NOT ORIGINALLY PRESCRIBED BY YOU, BUT SENT PER PAT ...  . VENLAFAXINE HCL 75 MG PO TABS Oral Take 75 mg by mouth daily.    Marland Kitchen CETIRIZINE HCL 10 MG PO TABS Oral Take 1 tablet (10 mg total) by mouth daily. 30 tablet 1  . NAPROXEN 500 MG PO TABS Oral Take 1 tablet (500 mg total) by mouth 2 (two) times daily with a meal. 30 tablet 0    BP 143/75  Pulse 84  Temp(Src) 98.4 F (36.9 C) (Oral)  Resp 20  Ht 5\' 7"  (1.702 m)  Wt 160 lb (72.576 kg)  BMI 25.06 kg/m2  SpO2 99%  Physical Exam  Nursing note and vitals reviewed. Constitutional: He is oriented to person, place, and time. He appears well-developed and well-nourished. No distress.       Pt does not appear uncomfortable.  Sitting up in bed, reading.   HENT:  Head: Normocephalic and atraumatic.  Mouth/Throat: Oropharynx is clear and moist.  Mild, diffuse sinus tenderness  Eyes:       Normal appearance  Neck: Normal range of motion.       No meningeal signs  Cardiovascular: Normal rate and regular rhythm.        BP 143/75  Pulmonary/Chest: Effort normal and breath sounds normal.  Musculoskeletal: Normal range of motion.  Neurological: He is alert and oriented to person, place, and time. He has normal reflexes. No cranial nerve deficit or sensory deficit. Coordination normal.       5/5 and equal upper and lower extremity strength.  No past pointing.     Skin: Skin is warm and dry. No rash noted.  Psychiatric: He has a normal mood and affect. His behavior is normal.    ED Course  Procedures (including critical care time)  Labs Reviewed - No data to display No results found.   1. Headache   2. Hypertension       MDM  Healthy 41yo M presents w/ c/o HTN  and headache.  Has had similar headaches of even worse intensity in the past.  BP 143/75.  I explained to pt that is likely not the etiology of his headache, he should f/u with his PCP and keep a diary of pressures in the meantime.  Afebrile, no focal neuro deficits or meningeal signs on exam.  Will treat pain w/ IM reglan and reassess shortly.  6:06 PM   Pt reports that his headache is much better.  I recommended that he take ibuprofen for headache pain in the future.  He will f/u with his PCP for HTN as well as fatigue if this becomes a persistent problem.  Return precautions discussed.  7:28 PM         Otilio Miu, [place] 01/07/12 1928

## 2012-01-07 NOTE — Discharge Instructions (Signed)
Take ibuprofen w/ food up to three times a day, as needed for pain.  Follow up with your doctor for blood pressure recheck as soon as possible.  In the meantime, keep a diary of your blood pressure measurements.  You should also discuss your recent fatigue with your doctor, but this is likely not anything you need to worry about at this time.  You should return to the ER if your headache worsens or you develop associated fever.

## 2012-01-07 NOTE — ED Notes (Signed)
Pt reports headache x 2 hours- states he has been sleeping a lot over last 24 hours- also concerned about that his bp is elevated

## 2012-01-08 NOTE — ED Provider Notes (Signed)
Medical screening examination/treatment/procedure(s) were performed by non-physician practitioner and as supervising physician I was immediately available for consultation/collaboration.  Doug Sou, MD 01/08/12 [phone]

## 2012-02-15 ENCOUNTER — Other Ambulatory Visit: Payer: Self-pay | Admitting: Physical Medicine & Rehabilitation

## 2012-03-17 ENCOUNTER — Encounter (HOSPITAL_BASED_OUTPATIENT_CLINIC_OR_DEPARTMENT_OTHER): Payer: Self-pay | Admitting: Emergency Medicine

## 2012-03-17 ENCOUNTER — Emergency Department (HOSPITAL_BASED_OUTPATIENT_CLINIC_OR_DEPARTMENT_OTHER)
Admission: EM | Admit: 2012-03-17 | Discharge: 2012-03-17 | Disposition: A | Discharge status: Home / Self Care | Payer: Worker's Compensation | Attending: Emergency Medicine | Admitting: Emergency Medicine

## 2012-03-17 DIAGNOSIS — R209 Unspecified disturbances of skin sensation: Secondary | ICD-10-CM | POA: Insufficient documentation

## 2012-03-17 DIAGNOSIS — M79609 Pain in unspecified limb: Secondary | ICD-10-CM | POA: Insufficient documentation

## 2012-03-17 DIAGNOSIS — G629 Polyneuropathy, unspecified: Secondary | ICD-10-CM

## 2012-03-17 DIAGNOSIS — Z87891 Personal history of nicotine dependence: Secondary | ICD-10-CM | POA: Insufficient documentation

## 2012-03-17 DIAGNOSIS — F329 Major depressive disorder, single episode, unspecified: Secondary | ICD-10-CM | POA: Insufficient documentation

## 2012-03-17 DIAGNOSIS — F3289 Other specified depressive episodes: Secondary | ICD-10-CM | POA: Insufficient documentation

## 2012-03-17 DIAGNOSIS — Z87828 Personal history of other (healed) physical injury and trauma: Secondary | ICD-10-CM | POA: Insufficient documentation

## 2012-03-17 MED ORDER — METHYLPREDNISOLONE 4 MG PO KIT: PACK | ORAL | Status: AC

## 2012-03-17 NOTE — Discharge Instructions (Signed)
Peripheral Nerve Problems Peripheral nerve disorders are problems with the nerves in your arms or legs. CAUSES  There are many different causes of these disorders. They include:  Injury.   Diabetes.   Chronic alcoholism.   Toxic chemicals and drugs.   Vitamin deficiencies.   Tumors.   Liver or kidney diseases.  SYMPTOMS  Some of the problems caused are:  Tingling, burning, pain, and numbness in the extremities and feet.   Weakness, loss of muscle tone, and size.  DIAGNOSIS  Sometimes blood tests and studies to examine nerve function are needed to make the diagnosis.  TREATMENT  Sometimes peripheral nerve problems can be treated with vitamins, medication, and avoiding known toxins such as alcohol. Please make a follow-up appointment to be sure you are getting better with treatment.  SEEK MEDICAL CARE IF:   You are not better after one week of treatment.   You have worsening of problems or have breathing trouble.  Document Released: 11/12/2004 Document Revised: 09/24/2011 Document Reviewed: 10/05/2005 [employer] Patient Information 2012 [employer]. 

## 2012-03-17 NOTE — ED Notes (Signed)
C/o of numbness and pain to left lower leg since Monday- Hx of work injury 1 year ago

## 2012-03-17 NOTE — ED Provider Notes (Signed)
History     CSN: [id number]  Arrival date & time 03/17/12  1649   First MD Initiated Contact with Patient 03/17/12 1719      Chief Complaint  Patient presents with  . Leg Pain    (Consider location/radiation/quality/duration/timing/severity/associated sxs/prior treatment) Patient is a 41 y.o. male presenting with leg pain. The history is provided by the patient.  Leg Pain  The incident occurred 2 days ago. The incident occurred at home. There was no injury mechanism. The pain is present in the left leg and left foot. The quality of the pain is described as burning and tingling (numbness). The pain is at a severity of 4/10. The pain is moderate. The pain has been constant since onset. Associated symptoms include numbness and tingling. Pertinent negatives include no inability to bear weight and no muscle weakness. He reports no foreign bodies present. The symptoms are aggravated by activity and bearing weight. He has tried nothing for the symptoms. The treatment provided no relief.    Past Medical History  Diagnosis Date  . Depression     Past Surgical History  Procedure Date  . Eye surgery     Family History  Problem Relation Age of Onset  . Cancer Mother   . Cancer Father   . Diabetes Father   . Heart disease Father     History  Substance Use Topics  . Smoking status: Former Games developer  . Smokeless tobacco: Never Used  . Alcohol Use: No      Review of Systems  Neurological: Positive for tingling and numbness.  All other systems reviewed and are negative.    Allergies  Review of patient's allergies indicates no known allergies.  Home Medications   Current Outpatient Rx  Name Route Sig Dispense Refill  . BEE POLLEN PO Oral Take 1 capsule by mouth daily.      Marland Kitchen CETIRIZINE HCL 10 MG PO TABS Oral Take 1 tablet (10 mg total) by mouth daily. 30 tablet 1  . FEXOFENADINE HCL 180 MG PO TABS Oral Take 180 mg by mouth daily.    . IBUPROFEN 800 MG PO TABS  TAKE 1 TABLET  BY MOUTH 3 TIMES A DAY AS NEEDED 90 tablet 3  . ADULT MULTIVITAMIN W/MINERALS CH Oral Take 1 tablet by mouth daily.    Marland Kitchen NAPROXEN 500 MG PO TABS Oral Take 1 tablet (500 mg total) by mouth 2 (two) times daily with a meal. 30 tablet 0  . TESTOSTERONE 20.25 MG/1.25GM (1.62%) TD GEL Transdermal Place 2 application onto the skin daily.      . TRAMADOL HCL 50 MG PO TABS  TAKE 1 TO 2 TABLETS BY MOUTH EVERY 6 TO 8 HOURS AS NEEDED FOR PAIN 60 tablet 2  . VENLAFAXINE HCL 75 MG PO TABS Oral Take 75 mg by mouth daily.      BP 144/89  Pulse 111  Temp(Src) 98.8 F (37.1 C) (Oral)  Resp 16  Ht 5\' 7"  (1.702 m)  Wt 160 lb (72.576 kg)  BMI 25.06 kg/m2  SpO2 100%  Physical Exam  Nursing note and vitals reviewed. Constitutional: He is oriented to person, place, and time. He appears well-developed and well-nourished. No distress.  HENT:  Head: Normocephalic and atraumatic.  Eyes: EOM are normal. Pupils are equal, round, and reactive to light.  Musculoskeletal:       Legs: Neurological: He is alert and oriented to person, place, and time. He has normal strength. A sensory deficit is present.  Decreased sensation over the left lateral tib-fib and over the anterior foot.  Normal 2+ pulses in the dorsalis pedis and posterior tibial on the muscle 2 second capillary refill    ED Course  Procedures (including critical care time)  Labs Reviewed - No data to display No results found.   No diagnosis found.    MDM   Patient with a prior work-related accident to his left foot resulting in numbness for a period of time which gradually improved. 2 days ago when he woke up he was having numbness and tingling in his left foot in the lateral portion of his tib-fib. He denies any other symptoms or any new injury. He has an appointment to see the specialist in a week and a half but they wanted him to be evaluated to ensure everything was okay prior to. On exam patient's foot is warm with normal DP and PT  pulses. There is good capillary refill and sensation. He has full range of motion of his ankle and no calf pain or swelling. Discussed with patient continuing his ibuprofen and tramadol for pain and inflammation and will start a course of steroids.        Gwyneth Sprout, MD 03/17/12 1757

## 2012-03-22 ENCOUNTER — Ambulatory Visit: Payer: Self-pay | Admitting: Neurosurgery

## 2012-04-20 ENCOUNTER — Other Ambulatory Visit: Payer: Self-pay | Admitting: Physical Medicine & Rehabilitation

## 2012-06-22 ENCOUNTER — Ambulatory Visit: Payer: Self-pay | Admitting: Physical Medicine & Rehabilitation

## 2012-06-23 ENCOUNTER — Ambulatory Visit: Payer: Self-pay | Admitting: Physical Medicine & Rehabilitation

## 2016-04-06 ENCOUNTER — Emergency Department (HOSPITAL_BASED_OUTPATIENT_CLINIC_OR_DEPARTMENT_OTHER)
Admission: EM | Admit: 2016-04-06 | Discharge: 2016-04-06 | Disposition: A | Discharge status: Home / Self Care | Payer: PRIVATE HEALTH INSURANCE | Attending: Emergency Medicine | Admitting: Emergency Medicine

## 2016-04-06 ENCOUNTER — Encounter (HOSPITAL_BASED_OUTPATIENT_CLINIC_OR_DEPARTMENT_OTHER): Payer: Self-pay | Admitting: Emergency Medicine

## 2016-04-06 DIAGNOSIS — Z87891 Personal history of nicotine dependence: Secondary | ICD-10-CM | POA: Insufficient documentation

## 2016-04-06 DIAGNOSIS — F329 Major depressive disorder, single episode, unspecified: Secondary | ICD-10-CM | POA: Insufficient documentation

## 2016-04-06 DIAGNOSIS — M545 Low back pain, unspecified: Secondary | ICD-10-CM

## 2016-04-06 HISTORY — DX: Calculus of kidney: N20.0

## 2016-04-06 LAB — URINALYSIS, ROUTINE W REFLEX MICROSCOPIC
Bilirubin Urine: NEGATIVE
GLUCOSE, UA: NEGATIVE mg/dL
HGB URINE DIPSTICK: NEGATIVE
KETONES UR: NEGATIVE mg/dL
Leukocytes, UA: NEGATIVE
Nitrite: NEGATIVE
PROTEIN: NEGATIVE mg/dL
Specific Gravity, Urine: 1.009 (ref 1.005–1.030)
pH: 7 (ref 5.0–8.0)

## 2016-04-06 MED ORDER — KETOROLAC TROMETHAMINE 60 MG/2ML IM SOLN
60.0000 mg | Freq: Once | INTRAMUSCULAR | Status: AC
  Administered 2016-04-06: 60 mg via INTRAMUSCULAR
  Filled 2016-04-06: qty 2

## 2016-04-06 MED ORDER — IBUPROFEN 600 MG PO TABS: 600.0000 mg | ORAL_TABLET | Freq: Four times a day (QID) | ORAL | Status: DC | PRN

## 2016-04-06 MED ORDER — METHOCARBAMOL 500 MG PO TABS: 500.0000 mg | ORAL_TABLET | Freq: Four times a day (QID) | ORAL | Status: DC | PRN

## 2016-04-06 NOTE — ED Notes (Signed)
Pt with right back pain that radiates to right flank, pt thought he pulled muscle so he laid on heating pad on Sunday with no improvement, this am pt noted his urine was very dark. H/o kidney stones, but states that these symptoms are not the same as last kidney stone,

## 2016-04-06 NOTE — ED Provider Notes (Signed)
CSN: [id number]     Arrival date & time 04/06/16  1018 History   First MD Initiated Contact with Patient 04/06/16 1049     Chief Complaint  Patient presents with  . Urinary Retention     (Consider location/radiation/quality/duration/timing/severity/associated sxs/prior Treatment) HPI Patient states he has a history of chronic back pain. Started having right-sided low back pain yesterday. States he used a heating pad overnight with no improvement. He woke this morning and urinated. States it was dark urine. No gross blood. No difficulty urinating. States his back pain is constant and worse with movement. It radiates into his right buttocks. He has no abdominal pain. No nausea vomiting. No fever or chills. Denies any focal weakness or numbness. Past Medical History  Diagnosis Date  . Depression   . Kidney stone    Past Surgical History  Procedure Laterality Date  . Eye surgery     Family History  Problem Relation Age of Onset  . Cancer Mother   . Cancer Father   . Diabetes Father   . Heart disease Father    Social History  Substance Use Topics  . Smoking status: Former Games developer  . Smokeless tobacco: Never Used  . Alcohol Use: No    Review of Systems  Constitutional: Negative for fever and chills.  Cardiovascular: Negative for chest pain.  Gastrointestinal: Negative for nausea, vomiting, abdominal pain, diarrhea and constipation.  Genitourinary: Negative for frequency, hematuria, flank pain, difficulty urinating, penile pain and testicular pain.  Musculoskeletal: Positive for myalgias and back pain. Negative for neck pain and neck stiffness.  Skin: Negative for rash and wound.  Neurological: Negative for dizziness, weakness, light-headedness, numbness and headaches.  All other systems reviewed and are negative.     Allergies  Review of patient's allergies indicates no known allergies.  Home Medications   Prior to Admission medications   Medication Sig Start Date End  Date Taking? Authorizing Provider  ibuprofen (ADVIL,MOTRIN) 600 MG tablet Take 1 tablet (600 mg total) by mouth every 6 (six) hours as needed for moderate pain. 04/06/16   Loren Racer, MD  methocarbamol (ROBAXIN) 500 MG tablet Take 1 tablet (500 mg total) by mouth every 6 (six) hours as needed for muscle spasms. 04/06/16   Loren Racer, MD   BP 136/93 mmHg  Pulse 56  Temp(Src) 98.5 F (36.9 C) (Oral)  Resp 16  Ht  (1.702 m)  Wt 180 lb (81.647 kg)  BMI 28.19 kg/m2  SpO2 100% Physical Exam  Constitutional: He is oriented to person, place, and time. He appears well-developed and well-nourished. No distress.  HENT:  Head: Normocephalic and atraumatic.  Mouth/Throat: Oropharynx is clear and moist.  Eyes: EOM are normal. Pupils are equal, round, and reactive to light.  Neck: Normal range of motion. Neck supple.  Cardiovascular: Normal rate and regular rhythm.   Pulmonary/Chest: Effort normal and breath sounds normal. No respiratory distress. He has no wheezes. He has no rales.  Abdominal: Soft. Bowel sounds are normal. He exhibits no distension and no mass. There is no tenderness. There is no rebound and no guarding.  Musculoskeletal: Normal range of motion. He exhibits tenderness. He exhibits no edema.  Patient has mild right-sided paraspinal tenderness in the lumbar region of his back. There is no midline thoracic or lumbar tenderness. No CVA tenderness bilaterally. Negative straight leg raise. Distal pulses are 2+. No lower extremity asymmetry or tenderness.  Neurological: He is alert and oriented to person, place, and time.  5/5 motor  in all extremities. Sensation is fully intact. No saddle anesthesia. Patient ambulating without any difficulty.  Skin: Skin is warm and dry. No rash noted. No erythema.  Psychiatric: He has a normal mood and affect. His behavior is normal.  Nursing note and vitals reviewed.   ED Course  Procedures (including critical care time) Labs  Review Labs Reviewed  URINALYSIS, ROUTINE W REFLEX MICROSCOPIC (NOT [facility])    Imaging Review No results found. I have personally reviewed and evaluated these images and lab results as part of my medical decision-making.   EKG Interpretation None      MDM   Final diagnoses:  Right-sided low back pain without sciatica   Symptoms and exam consistent with lumbar strain. Normal neurologic exam. No concerning signs of cauda equina syndrome. Return precautions given.     Loren Raceravid Kysean Sweet, MD 04/06/16 [phone]

## 2016-04-06 NOTE — Discharge Instructions (Signed)

## 2016-10-30 ENCOUNTER — Emergency Department (HOSPITAL_BASED_OUTPATIENT_CLINIC_OR_DEPARTMENT_OTHER): Payer: PRIVATE HEALTH INSURANCE

## 2016-10-30 ENCOUNTER — Encounter (HOSPITAL_BASED_OUTPATIENT_CLINIC_OR_DEPARTMENT_OTHER): Payer: Self-pay | Admitting: *Deleted

## 2016-10-30 ENCOUNTER — Emergency Department (HOSPITAL_BASED_OUTPATIENT_CLINIC_OR_DEPARTMENT_OTHER)
Admission: EM | Admit: 2016-10-30 | Discharge: 2016-10-30 | Disposition: A | Discharge status: Home / Self Care | Payer: PRIVATE HEALTH INSURANCE | Attending: Emergency Medicine | Admitting: Emergency Medicine

## 2016-10-30 DIAGNOSIS — Z87891 Personal history of nicotine dependence: Secondary | ICD-10-CM | POA: Insufficient documentation

## 2016-10-30 DIAGNOSIS — G44209 Tension-type headache, unspecified, not intractable: Secondary | ICD-10-CM | POA: Insufficient documentation

## 2016-10-30 DIAGNOSIS — Z791 Long term (current) use of non-steroidal anti-inflammatories (NSAID): Secondary | ICD-10-CM | POA: Insufficient documentation

## 2016-10-30 DIAGNOSIS — J069 Acute upper respiratory infection, unspecified: Secondary | ICD-10-CM | POA: Insufficient documentation

## 2016-10-30 MED ORDER — IBUPROFEN 800 MG PO TABS
800.0000 mg | ORAL_TABLET | Freq: Once | ORAL | Status: AC
  Administered 2016-10-30: 800 mg via ORAL
  Filled 2016-10-30: qty 1

## 2016-10-30 MED ORDER — KETOROLAC TROMETHAMINE 30 MG/ML IJ SOLN
30.0000 mg | Freq: Once | INTRAMUSCULAR | Status: AC
  Administered 2016-10-30: 30 mg via INTRAMUSCULAR
  Filled 2016-10-30: qty 1

## 2016-10-30 MED ORDER — NAPROXEN 500 MG PO TABS: 500.0000 mg | ORAL_TABLET | Freq: Two times a day (BID) | ORAL | 0 refills | Status: DC

## 2016-10-30 NOTE — ED Provider Notes (Signed)
Emergency Department Provider Note   I have reviewed the triage vital signs and the nursing notes.  By signing my name below, I, Valentino SaxonBianca Contreras, attest that this documentation has been prepared under the direction and in the presence of Maia PlanJoshua G Gratia Disla, MD. Electronically Signed: Valentino SaxonBianca Contreras, ED Scribe. 10/30/16. 8:47 PM.  HISTORY  Chief Complaint Cough and Headache   HPI Comments: Clayton Bullock is a 46 y.o. male with no pertinent PMHx, who presents to the Emergency Department complaining of moderate, constant, HA onset yesterday afternoon. Pt reports associated subjective fever, sore throat, bilateral ear pain, generalized abdominal pain and generalized muscle aches. Pt's temperature in the ED today was 99.2. Pt states Motrin given in ED today provided minimal relief. He notes having a cough last week, but states it has been relieved. No OTC medications tried at home for relief. Denies nausea and vomiting.    Past Medical History:  Diagnosis Date  . Depression   . Kidney stone     Patient Active Problem List   Diagnosis Date Noted  . 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 EYE SURGERY      Current Outpatient Rx  . Order #: [id number] Class: Print  . Order #: [id number] Class: Print  . Order #: [id number] Class: Print    Allergies Patient has no known allergies.  Family History  Problem Relation Age of Onset  . Cancer Mother   . Cancer Father   . Diabetes Father   . Heart disease Father     Social History Social History  Substance Use Topics  . Smoking status: Former Games developermoker  . Smokeless tobacco: Never Used  . Alcohol use No    Review of Systems  Constitutional: Fever. No chills.  Eyes: No visual changes. ENT: Headache, ear pain, sore throat. Cardiovascular: Denies chest pain. Respiratory: Denies shortness of breath. Gastrointestinal: Abdominal pain.  No nausea, no vomiting.  No diarrhea.  No  constipation. Genitourinary: Negative for dysuria. Musculoskeletal: Myalgia. Negative for back pain. Skin: Negative for rash. Neurological: Negative for headaches, focal weakness or numbness.  10-point ROS otherwise negative.  ____________________________________________   PHYSICAL EXAM:  VITAL SIGNS: ED Triage Vitals  Enc Vitals Group     BP 10/30/16 1707 143/65     Pulse Rate 10/30/16 1707 90     Resp 10/30/16 1707 18     Temp 10/30/16 1707 99.2 F (37.3 C)     Temp Source 10/30/16 1707 Oral     SpO2 10/30/16 1707 99 %     Weight 10/30/16 1706 180 lb (81.6 kg)     Height 10/30/16 1706 5\' 7"  (1.702 m)     Pain Score 10/30/16 1706 9   Constitutional: Alert and oriented. Well appearing and in no acute distress. Eyes: Conjunctivae are normal.  Head: Atraumatic. Ear/Nose: No congestion/rhinnorhea. Normal TMs bilaterally.  Mouth/Throat: Mucous membranes are moist.  Oropharynx non-erythematous. Neck: No stridor.  No meningeal signs. Cardiovascular: Normal rate, regular rhythm. Good peripheral circulation. Grossly normal heart sounds.   Respiratory: Normal respiratory effort.  No retractions. Lungs CTAB. Gastrointestinal: Soft and nontender. No distention.  Musculoskeletal: No lower extremity tenderness nor edema. No gross deformities of extremities. Neurologic:  Normal speech and language. No gross focal neurologic deficits are appreciated.  Skin:  Skin is warm, dry and intact. No rash noted.  ____________________________________________    DIAGNOSTIC STUDIES: Oxygen Saturation is 99% on RA, normal by my interpretation.  COORDINATION OF CARE: 8:47 PM Discussed treatment plan with pt at bedside which includes chest imaging, nonsteroidal anti-Inflammatory drug and  pt agreed to plan. ____________________________________________  RADIOLOGY  Dg Chest 2 View  Result Date: 10/30/2016 CLINICAL DATA:  46 y/o  M; cough. EXAM: CHEST  2 VIEW COMPARISON:  01/19/2010 chest  radiograph FINDINGS: The heart size and mediastinal contours are within normal limits and stable. Both lungs are clear. The visualized skeletal structures are unremarkable. IMPRESSION: No active cardiopulmonary disease. Electronically Signed   By: Mitzi Hansen M.D.   On: 10/30/2016 19:27    ____________________________________________   PROCEDURES  Procedure(s) performed:   Procedures  None ____________________________________________   INITIAL IMPRESSION / ASSESSMENT AND PLAN / ED COURSE  Pertinent labs & imaging results that were available during my care of the patient were reviewed by me and considered in my medical decision making (see chart for details).  Patient presents to the ED with URI symptoms and HA. Not consistent with bacterial meningitis. CXR negative for PNA. Plan for conservative mgmt at home with PCP follow up PRN.   At this time, I do not feel there is any life-threatening condition present. I have reviewed and discussed all results (EKG, imaging, lab, urine as appropriate), exam findings with patient. I have reviewed nursing notes and appropriate previous records.  I feel the patient is safe to be discharged home without further emergent workup. Discussed usual and customary return precautions. Patient and family (if present) verbalize understanding and are comfortable with this plan.  Patient will follow-up with their primary care provider. If they do not have a primary care provider, information for follow-up has been provided to them. All questions have been answered.  ____________________________________________  FINAL CLINICAL IMPRESSION(S) / ED DIAGNOSES  Final diagnoses:  Upper respiratory tract infection, unspecified type  Tension-type headache, not intractable, unspecified chronicity pattern     MEDICATIONS GIVEN DURING THIS VISIT:  Medications  ibuprofen (ADVIL,MOTRIN) tablet 800 mg (800 mg Oral Given 10/30/16 1953)  ketorolac (TORADOL)  30 MG/ML injection 30 mg (30 mg Intramuscular Given 10/30/16 2047)     NEW OUTPATIENT MEDICATIONS STARTED DURING THIS VISIT:  New Prescriptions   NAPROXEN (NAPROSYN) 500 MG TABLET    Take 1 tablet (500 mg total) by mouth 2 (two) times daily.    I personally performed the services described in this documentation, which was scribed in my presence. The recorded information has been reviewed and is accurate.    Note:  This document was prepared using Dragon voice recognition software and may include unintentional dictation errors.  Alona Bene, MD Emergency Medicine    Maia Plan, MD 10/31/16 1057

## 2016-10-30 NOTE — Discharge Instructions (Signed)

## 2016-10-30 NOTE — ED Triage Notes (Signed)
Chills, headache, cough, fever, ear and throat pain.

## 2017-05-10 ENCOUNTER — Emergency Department (HOSPITAL_BASED_OUTPATIENT_CLINIC_OR_DEPARTMENT_OTHER): Payer: Self-pay

## 2017-05-10 ENCOUNTER — Encounter (HOSPITAL_BASED_OUTPATIENT_CLINIC_OR_DEPARTMENT_OTHER): Payer: Self-pay

## 2017-05-10 ENCOUNTER — Emergency Department (HOSPITAL_BASED_OUTPATIENT_CLINIC_OR_DEPARTMENT_OTHER)
Admission: EM | Admit: 2017-05-10 | Discharge: 2017-05-10 | Disposition: A | Discharge status: Home / Self Care | Payer: Self-pay | Attending: Emergency Medicine | Admitting: Emergency Medicine

## 2017-05-10 DIAGNOSIS — R1084 Generalized abdominal pain: Secondary | ICD-10-CM | POA: Insufficient documentation

## 2017-05-10 DIAGNOSIS — Z79899 Other long term (current) drug therapy: Secondary | ICD-10-CM | POA: Insufficient documentation

## 2017-05-10 DIAGNOSIS — N200 Calculus of kidney: Secondary | ICD-10-CM | POA: Insufficient documentation

## 2017-05-10 DIAGNOSIS — Z87891 Personal history of nicotine dependence: Secondary | ICD-10-CM | POA: Insufficient documentation

## 2017-05-10 LAB — COMPREHENSIVE METABOLIC PANEL
ALT: 19 U/L (ref 17–63)
ANION GAP: 10 (ref 5–15)
AST: 17 U/L (ref 15–41)
Albumin: 4.1 g/dL (ref 3.5–5.0)
Alkaline Phosphatase: 71 U/L (ref 38–126)
BILIRUBIN TOTAL: 0.8 mg/dL (ref 0.3–1.2)
BUN: 19 mg/dL (ref 6–20)
CO2: 26 mmol/L (ref 22–32)
Calcium: 9.5 mg/dL (ref 8.9–10.3)
Chloride: 105 mmol/L (ref 101–111)
Creatinine, Ser: 1.05 mg/dL (ref 0.61–1.24)
Glucose, Bld: 110 mg/dL — ABNORMAL HIGH (ref 65–99)
POTASSIUM: 3.5 mmol/L (ref 3.5–5.1)
Sodium: 141 mmol/L (ref 135–145)
TOTAL PROTEIN: 7.1 g/dL (ref 6.5–8.1)

## 2017-05-10 LAB — CBC WITH DIFFERENTIAL/PLATELET
BASOS ABS: 0 10*3/uL (ref 0.0–0.1)
BASOS PCT: 0 %
Eosinophils Absolute: 0.2 10*3/uL (ref 0.0–0.7)
Eosinophils Relative: 3 %
HEMATOCRIT: 41.8 % (ref 39.0–52.0)
Hemoglobin: 14.8 g/dL (ref 13.0–17.0)
Lymphocytes Relative: 24 %
Lymphs Abs: 1.7 10*3/uL (ref 0.7–4.0)
MCH: 31.4 pg (ref 26.0–34.0)
MCHC: 35.4 g/dL (ref 30.0–36.0)
MCV: 88.6 fL (ref 78.0–100.0)
MONO ABS: 0.6 10*3/uL (ref 0.1–1.0)
Monocytes Relative: 8 %
NEUTROS ABS: 4.6 10*3/uL (ref 1.7–7.7)
Neutrophils Relative %: 65 %
PLATELETS: 231 10*3/uL (ref 150–400)
RBC: 4.72 MIL/uL (ref 4.22–5.81)
RDW: 13.8 % (ref 11.5–15.5)
WBC: 7.2 10*3/uL (ref 4.0–10.5)

## 2017-05-10 LAB — URINALYSIS, ROUTINE W REFLEX MICROSCOPIC
Bilirubin Urine: NEGATIVE
Glucose, UA: NEGATIVE mg/dL
Hgb urine dipstick: NEGATIVE
KETONES UR: NEGATIVE mg/dL
LEUKOCYTES UA: NEGATIVE
NITRITE: NEGATIVE
PROTEIN: NEGATIVE mg/dL
Specific Gravity, Urine: 1.02 (ref 1.005–1.030)
pH: 6.5 (ref 5.0–8.0)

## 2017-05-10 LAB — LIPASE, BLOOD: LIPASE: 28 U/L (ref 11–51)

## 2017-05-10 MED ORDER — DICYCLOMINE HCL 10 MG/ML IM SOLN
20.0000 mg | Freq: Once | INTRAMUSCULAR | Status: AC
  Administered 2017-05-10: 20 mg via INTRAMUSCULAR
  Filled 2017-05-10: qty 2

## 2017-05-10 NOTE — ED Triage Notes (Signed)
C/o abd pain x 1 month-worse x 2-3 days-NAD-steady gait

## 2017-05-10 NOTE — Discharge Instructions (Signed)
Read the information below.  You may return to the Emergency Department at any time for worsening condition or any new symptoms that concern you.  If you develop high fevers, worsening abdominal pain, uncontrolled vomiting, or are unable to tolerate fluids by mouth, return to the ER for a recheck.   °

## 2017-05-10 NOTE — ED Notes (Signed)
Patient transported to Ultrasound 

## 2017-05-10 NOTE — ED Provider Notes (Signed)
[facility] Provider Note   CSN: [id number] Arrival date & time: 05/10/17  1200     History   Chief Complaint Chief Complaint  Patient presents with  . Abdominal Pain    HPI Clayton Bullock is a 46 y.o. male.  HPI   Pt with hx depression, kidney stone, p/w diffuse abdominal pain that has been intermittent for about 1 month, more consistent and worse over the past 4 days.  It is sharp and cramping, intermittent, random, not related to eating.  Has associated fatigue.  Denies fevers, chills,myalgias, N/V, bowel changes, urinary symptoms, testicular pain or swelling.  Denies ETOH use.  Does take ibuprofen daily for back pain.  No hx abdominal surgeries.    Past Medical History:  Diagnosis Date  . Depression   . Kidney stone     Patient Active Problem List   Diagnosis Date Noted  . 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 EYE SURGERY         Home Medications    Prior to Admission medications   Medication Sig Start Date End Date Taking? Authorizing Provider  Sertraline HCl (ZOLOFT PO) Take by mouth.   Yes [provider]    Family History Family History  Problem Relation Age of Onset  . Cancer Mother   . Cancer Father   . Diabetes Father   . Heart disease Father     Social History Social History  Substance Use Topics  . Smoking status: Former Games developermoker  . Smokeless tobacco: Never Used  . Alcohol use No     Allergies   Patient has no known allergies.   Review of Systems Review of Systems  All other systems reviewed and are negative.    Physical Exam Updated Vital Signs BP 134/79 (BP Location: Left Arm)   Pulse 63   Temp 99 F (37.2 C) (Oral)   Resp 18   Ht 5\' 7"  (1.702 m)   Wt 82.1 kg (181 lb)   SpO2 99%   BMI 28.35 kg/m   Physical Exam  Constitutional: He appears well-developed and well-nourished. No distress.  HENT:  Head: Normocephalic and atraumatic.  Neck:  Neck supple.  Cardiovascular: Normal rate.   Pulmonary/Chest: Effort normal.  Abdominal: Soft. He exhibits no distension and no mass. There is tenderness (diffuse). There is no rebound and no guarding.  Neurological: He is alert.  Skin: He is not diaphoretic.  Psychiatric: He has a normal mood and affect. His behavior is normal.  Nursing note and vitals reviewed.    ED Treatments / Results  Labs (all labs ordered are listed, but only abnormal results are displayed) Labs Reviewed  COMPREHENSIVE METABOLIC PANEL - Abnormal; Notable for the following:       Result Value   Glucose, Bld 110 (*)    All other components within normal limits  LIPASE, BLOOD  CBC WITH DIFFERENTIAL/PLATELET  URINALYSIS, ROUTINE W REFLEX MICROSCOPIC    EKG  EKG Interpretation None       Radiology [place] Abdomen Complete  Result Date: 05/10/2017 CLINICAL DATA:  Abdominal pain for 1 month worsened over past week, history of kidney stones, former smoker EXAM: ABDOMEN ULTRASOUND COMPLETE COMPARISON:  None FINDINGS: Gallbladder: Normally distended without stones or wall thickening. No pericholecystic fluid or sonographic Murphy sign. Common bile duct: Diameter: 2 mm diameter, normal Liver: Echogenic parenchyma, likely fatty infiltration though this can be seen with cirrhosis and certain  infiltrative disorders. No focal hepatic mass or nodularity. No intrahepatic biliary dilatation. Hepatopetal portal venous flow. IVC: Normal appearance Pancreas: Body and proximal tail normal appearance, remainder obscured by bowel gas Spleen: Normal appearance, 8.0 cm length Right Kidney: Length: 11.1 cm. Normal cortical thickness and echogenicity. No mass or hydronephrosis. Shadowing 10 mm calculus at RIGHT renal pelvis. Left Kidney: Length: 11.3 cm. 3 mm echogenic focus without definite shadowing mid LEFT kidney. No mass or hydronephrosis. Abdominal aorta: Suboptimally visualized due to bowel gas, grossly normal caliber Other findings:  No free fluid IMPRESSION: Probable fatty infiltration of liver as above. Nonobstructing 10 mm RIGHT renal calculus with questionable small nonobstructing LEFT renal calculus. Electronically Signed   By: Ulyses Southward M.D.   On: 05/10/2017 14:04    Procedures Procedures (including critical care time)  Medications Ordered in ED Medications  dicyclomine (BENTYL) injection 20 mg (20 mg Intramuscular Given 05/10/17 1340)     Initial Impression / Assessment and Plan / ED Course  I have reviewed the triage vital signs and the nursing notes.  Pertinent labs & imaging results that were available during my care of the patient were reviewed by me and considered in my medical decision making (see chart for details).     Afebrile, nontoxic patient with intermittent abdominal pain x 1 year.  Abdominal exam is benign.  No associated symptoms.  Labs unremarkable.  US demonstrates kidney stones and possible fatty liver.  Pt does not drink alcohol, advised close follow up with PCP.  D/C home with PCP, urology follow up.  Discussed result, findings, treatment, and follow up  with patient.  Pt given return precautions.  Pt verbalizes understanding and agrees with plan.       Final Clinical Impressions(s) / ED Diagnoses   Final diagnoses:  Generalized abdominal pain    New Prescriptions Discharge Medication List as of 05/10/2017  2:45 PM       Trixie Dredge, Cordelia Poche 05/10/17 1544    Alvira Monday, MD 05/11/17 1144

## 2018-12-01 ENCOUNTER — Encounter (HOSPITAL_BASED_OUTPATIENT_CLINIC_OR_DEPARTMENT_OTHER): Payer: Self-pay | Admitting: Emergency Medicine

## 2018-12-01 ENCOUNTER — Emergency Department (HOSPITAL_BASED_OUTPATIENT_CLINIC_OR_DEPARTMENT_OTHER)
Admission: EM | Admit: 2018-12-01 | Discharge: 2018-12-01 | Disposition: A | Discharge status: Home / Self Care | Payer: 59 | Attending: Emergency Medicine | Admitting: Emergency Medicine

## 2018-12-01 ENCOUNTER — Other Ambulatory Visit: Payer: Self-pay

## 2018-12-01 DIAGNOSIS — R251 Tremor, unspecified: Secondary | ICD-10-CM | POA: Diagnosis present

## 2018-12-01 DIAGNOSIS — I10 Essential (primary) hypertension: Secondary | ICD-10-CM

## 2018-12-01 DIAGNOSIS — Z87891 Personal history of nicotine dependence: Secondary | ICD-10-CM | POA: Diagnosis not present

## 2018-12-01 LAB — BASIC METABOLIC PANEL
ANION GAP: 10 (ref 5–15)
BUN: 21 mg/dL — ABNORMAL HIGH (ref 6–20)
CHLORIDE: 104 mmol/L (ref 98–111)
CO2: 23 mmol/L (ref 22–32)
CREATININE: 1.05 mg/dL (ref 0.61–1.24)
Calcium: 9.2 mg/dL (ref 8.9–10.3)
GFR calc Af Amer: >60 mL/min (ref >60)
GFR calc non Af Amer: >60 mL/min (ref >60)
Glucose, Bld: 93 mg/dL (ref 70–99)
POTASSIUM: 3.3 mmol/L — AB (ref 3.5–5.1)
SODIUM: 137 mmol/L (ref 135–145)

## 2018-12-01 LAB — CBC WITH DIFFERENTIAL/PLATELET
Abs Immature Granulocytes: 0.03 10*3/uL (ref 0.00–0.07)
BASOS ABS: 0 10*3/uL (ref 0.0–0.1)
Basophils Relative: 0 %
EOS PCT: 1 %
Eosinophils Absolute: 0.1 10*3/uL (ref 0.0–0.5)
HCT: 46.1 % (ref 39.0–52.0)
HEMOGLOBIN: 15.5 g/dL (ref 13.0–17.0)
Immature Granulocytes: 0 %
LYMPHS PCT: 25 %
Lymphs Abs: 2.2 10*3/uL (ref 0.7–4.0)
MCH: 30 pg (ref 26.0–34.0)
MCHC: 33.6 g/dL (ref 30.0–36.0)
MCV: 89.2 fL (ref 80.0–100.0)
Monocytes Absolute: 0.7 10*3/uL (ref 0.1–1.0)
Monocytes Relative: 8 %
NEUTROS ABS: 5.9 10*3/uL (ref 1.7–7.7)
Neutrophils Relative %: 66 %
Platelets: 260 10*3/uL (ref 150–400)
RBC: 5.17 MIL/uL (ref 4.22–5.81)
RDW: 13.4 % (ref 11.5–15.5)
WBC: 8.9 10*3/uL (ref 4.0–10.5)
nRBC: 0 % (ref 0.0–0.2)

## 2018-12-01 LAB — URINALYSIS, ROUTINE W REFLEX MICROSCOPIC
BILIRUBIN URINE: NEGATIVE
Glucose, UA: NEGATIVE mg/dL
Hgb urine dipstick: NEGATIVE
KETONES UR: NEGATIVE mg/dL
LEUKOCYTE UA: NEGATIVE
Nitrite: NEGATIVE
Protein, ur: NEGATIVE mg/dL
SPECIFIC GRAVITY, URINE: 1.01 (ref 1.005–1.030)
pH: 7 (ref 5.0–8.0)

## 2018-12-01 MED ORDER — HYDROCHLOROTHIAZIDE 25 MG PO TABS: 25.0000 mg | ORAL_TABLET | Freq: Every day | ORAL | 0 refills | Status: AC

## 2018-12-01 NOTE — ED Provider Notes (Signed)
Emergency Department Provider Note   I have reviewed the triage vital signs and the nursing notes.   HISTORY  Chief Complaint Hypertension   HPI Clayton Bullock is a 48 y.o. male with PMH of depression presents to the emergency department with elevated blood pressure.  The patient states he has been feeling hot today and having occasional episodes where he was feeling tremulous.  This is been ongoing for the past several days and improved with eating.  Because of this, he was picking up some prescriptions and decided to check his blood pressure which registered high.  He had the pharmacist rechecked this manually which also read high and presented to the emergency department for further evaluation.  Patient states that he has had borderline elevated blood pressures in the past but is never been on medications.  He has been taking a daytime sinus/congestion type medication over the past several days.  He denies any weakness, numbness, headache, chest pain, shortness of breath, lower extremity swelling, or other symptoms.   Past Medical History:  Diagnosis Date  . Depression   . Kidney stone     Patient Active Problem List   Diagnosis Date Noted  . 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 EYE SURGERY     Allergies Patient has no known allergies.  Family History  Problem Relation Age of Onset  . Cancer Mother   . Cancer Father   . Diabetes Father   . Heart disease Father     Social History Social History   Tobacco Use  . Smoking status: Former Games developermoker  . Smokeless tobacco: Never Used  Substance Use Topics  . Alcohol use: No  . Drug use: No    Review of Systems  Constitutional: No fever/chills Eyes: No visual changes. ENT: No sore throat. Cardiovascular: Denies chest pain. Respiratory: Denies shortness of breath. Gastrointestinal: No abdominal pain.  No nausea, no vomiting.  No diarrhea.  No  constipation. Genitourinary: Negative for dysuria. Musculoskeletal: Negative for back pain. Skin: Negative for rash. Neurological: Negative for headaches, focal weakness or numbness.  10-point ROS otherwise negative.  ____________________________________________   PHYSICAL EXAM:  VITAL SIGNS: ED Triage Vitals  Enc Vitals Group     BP 12/01/18 1830 (!) 187/113     Pulse Rate 12/01/18 1830 92     Resp 12/01/18 1830 18     Temp 12/01/18 1830 98.6 F (37 C)     Temp Source 12/01/18 1830 Oral     SpO2 12/01/18 1830 100 %     Weight 12/01/18 1831 195 lb (88.5 kg)     Height 12/01/18 1831 5\' 7"  (1.702 m)    Constitutional: Alert and oriented. Well appearing and in no acute distress. Eyes: Conjunctivae are normal. PERRL.  Head: Atraumatic. Nose: No congestion/rhinnorhea. Mouth/Throat: Mucous membranes are moist.  Neck: No stridor.   Cardiovascular: Normal rate, regular rhythm. Good peripheral circulation. Grossly normal heart sounds.   Respiratory: Normal respiratory effort.  No retractions. Lungs CTAB. Gastrointestinal: Soft and nontender. No distention.  Musculoskeletal: No lower extremity tenderness nor edema. No gross deformities of extremities. Neurologic:  Normal speech and language. No gross focal neurologic deficits are appreciated.  Skin:  Skin is warm, dry and intact. No rash noted.   ____________________________________________   LABS (all labs ordered are listed, but only abnormal results are displayed)  Labs Reviewed  BASIC METABOLIC PANEL - Abnormal; Notable for the following components:  Result Value   Potassium 3.3 (*)    BUN 21 (*)    All other components within normal limits  CBC WITH DIFFERENTIAL/PLATELET  URINALYSIS, ROUTINE W REFLEX MICROSCOPIC    ____________________________________________   PROCEDURES  Procedure(s) performed:   Procedures  None ____________________________________________   INITIAL IMPRESSION / ASSESSMENT AND  PLAN / ED COURSE  Pertinent labs & imaging results that were available during my care of the patient were reviewed by me and considered in my medical decision making (see chart for details).  Patient presents to the emergency department with largely asymptomatic hypertension.  Plan for screening labs and UA.  Patient with normal neurologic exam.  No concern for hypertensive emergency based on exam.  I advised that he stop taking the daytime sinus medication as this could be contributing to his hypertension.  I will likely start him on a low-dose antihypertensive medication given the degree of hypertension here until her can f/u with his PCP.   Screening labs and urinalysis are largely unremarkable.  No evidence of hypertensive emergency.  Patient with very mild hypokalemia.  Plan to start HCTZ and have the patient follow with his primary care physician in the next 1 to 2 weeks for blood pressure recheck and medication titration as needed. ____________________________________________  FINAL CLINICAL IMPRESSION(S) / ED DIAGNOSES  Final diagnoses:  Essential hypertension     NEW OUTPATIENT MEDICATIONS STARTED DURING THIS VISIT:  Discharge Medication List as of 12/01/2018  8:36 PM    START taking these medications   Details  hydrochlorothiazide (HYDRODIURIL) 25 MG tablet Take 1 tablet (25 mg total) by mouth daily for 30 days., Starting Thu 12/01/2018, Until Sat 12/31/2018, Print        Note:  This document was prepared using Dragon voice recognition software and may include unintentional dictation errors.  Alona Bene, MD Emergency Medicine    Long, Arlyss Repress, MD 12/02/18 1002

## 2018-12-01 NOTE — Discharge Instructions (Signed)
As we discussed, though you do have high blood pressure (hypertension), fortunately it is not immediately dangerous at this time and does not need emergency intervention or admission to the hospital. I am starting a blood pressure medication. Follow up with your primary care doctor to evaluate you in clinic and decide if any medication changes are needed.  Please follow up in clinic as recommended in these papers.    Return to the Emergency Department (ED) if you experience any worsening chest pain/pressure/tightness, difficulty breathing, or sudden sweating, or other symptoms that concern you.

## 2018-12-01 NOTE — ED Triage Notes (Signed)
Reports took BP at [facility] today and it was 189/112.  Reports to ER for hypertension, denies history.  Denies chest pain, shortness of breath, dizziness, blurred vision.

## 2019-11-03 ENCOUNTER — Emergency Department (HOSPITAL_BASED_OUTPATIENT_CLINIC_OR_DEPARTMENT_OTHER)
Admission: EM | Admit: 2019-11-03 | Discharge: 2019-11-03 | Disposition: A | Discharge status: Home / Self Care | Payer: BC Managed Care – PPO | Attending: Emergency Medicine | Admitting: Emergency Medicine

## 2019-11-03 ENCOUNTER — Encounter (HOSPITAL_BASED_OUTPATIENT_CLINIC_OR_DEPARTMENT_OTHER): Payer: Self-pay

## 2019-11-03 ENCOUNTER — Other Ambulatory Visit: Payer: Self-pay

## 2019-11-03 ENCOUNTER — Emergency Department (HOSPITAL_BASED_OUTPATIENT_CLINIC_OR_DEPARTMENT_OTHER): Payer: BC Managed Care – PPO

## 2019-11-03 DIAGNOSIS — R918 Other nonspecific abnormal finding of lung field: Secondary | ICD-10-CM | POA: Insufficient documentation

## 2019-11-03 DIAGNOSIS — R1084 Generalized abdominal pain: Secondary | ICD-10-CM | POA: Insufficient documentation

## 2019-11-03 DIAGNOSIS — R509 Fever, unspecified: Secondary | ICD-10-CM | POA: Insufficient documentation

## 2019-11-03 DIAGNOSIS — R911 Solitary pulmonary nodule: Secondary | ICD-10-CM | POA: Insufficient documentation

## 2019-11-03 DIAGNOSIS — Z20822 Contact with and (suspected) exposure to covid-19: Secondary | ICD-10-CM | POA: Diagnosis not present

## 2019-11-03 DIAGNOSIS — Z87891 Personal history of nicotine dependence: Secondary | ICD-10-CM | POA: Insufficient documentation

## 2019-11-03 LAB — LIPASE, BLOOD: Lipase: 25 U/L (ref 11–51)

## 2019-11-03 LAB — LACTIC ACID, PLASMA: Lactic Acid, Venous: 0.9 mmol/L (ref 0.5–1.9)

## 2019-11-03 LAB — CBC WITH DIFFERENTIAL/PLATELET
Abs Immature Granulocytes: 0.01 10*3/uL (ref 0.00–0.07)
Basophils Absolute: 0 10*3/uL (ref 0.0–0.1)
Basophils Relative: 0 %
Eosinophils Absolute: 0 10*3/uL (ref 0.0–0.5)
Eosinophils Relative: 0 %
HCT: 42.1 % (ref 39.0–52.0)
Hemoglobin: 14.3 g/dL (ref 13.0–17.0)
Immature Granulocytes: 0 %
Lymphocytes Relative: 39 %
Lymphs Abs: 1.5 10*3/uL (ref 0.7–4.0)
MCH: 30.2 pg (ref 26.0–34.0)
MCHC: 34 g/dL (ref 30.0–36.0)
MCV: 89 fL (ref 80.0–100.0)
Monocytes Absolute: 0.3 10*3/uL (ref 0.1–1.0)
Monocytes Relative: 9 %
Neutro Abs: 1.9 10*3/uL (ref 1.7–7.7)
Neutrophils Relative %: 52 %
Platelets: 175 10*3/uL (ref 150–400)
RBC: 4.73 MIL/uL (ref 4.22–5.81)
RDW: 13.3 % (ref 11.5–15.5)
WBC: 3.8 10*3/uL — ABNORMAL LOW (ref 4.0–10.5)
nRBC: 0 % (ref 0.0–0.2)

## 2019-11-03 LAB — COMPREHENSIVE METABOLIC PANEL
ALT: 44 U/L (ref 0–44)
AST: 41 U/L (ref 15–41)
Albumin: 4.1 g/dL (ref 3.5–5.0)
Alkaline Phosphatase: 48 U/L (ref 38–126)
Anion gap: 11 (ref 5–15)
BUN: 25 mg/dL — ABNORMAL HIGH (ref 6–20)
CO2: 25 mmol/L (ref 22–32)
Calcium: 8.9 mg/dL (ref 8.9–10.3)
Chloride: 101 mmol/L (ref 98–111)
Creatinine, Ser: 1.13 mg/dL (ref 0.61–1.24)
GFR calc Af Amer: >60 mL/min (ref >60)
GFR calc non Af Amer: >60 mL/min (ref >60)
Glucose, Bld: 128 mg/dL — ABNORMAL HIGH (ref 70–99)
Potassium: 3.2 mmol/L — ABNORMAL LOW (ref 3.5–5.1)
Sodium: 137 mmol/L (ref 135–145)
Total Bilirubin: 0.8 mg/dL (ref 0.3–1.2)
Total Protein: 7 g/dL (ref 6.5–8.1)

## 2019-11-03 LAB — PROTIME-INR
INR: 1 (ref 0.8–1.2)
Prothrombin Time: 12.9 seconds (ref 11.4–15.2)

## 2019-11-03 LAB — TROPONIN I (HIGH SENSITIVITY)
Troponin I (High Sensitivity): 4 ng/L (ref <18)
Troponin I (High Sensitivity): 5 ng/L (ref <18)

## 2019-11-03 LAB — URINALYSIS, ROUTINE W REFLEX MICROSCOPIC
Bilirubin Urine: NEGATIVE
Glucose, UA: NEGATIVE mg/dL
Hgb urine dipstick: NEGATIVE
Ketones, ur: NEGATIVE mg/dL
Leukocytes,Ua: NEGATIVE
Nitrite: NEGATIVE
Protein, ur: NEGATIVE mg/dL
Specific Gravity, Urine: 1.025 (ref 1.005–1.030)
pH: 6.5 (ref 5.0–8.0)

## 2019-11-03 LAB — APTT: aPTT: 31 seconds (ref 24–36)

## 2019-11-03 LAB — SARS CORONAVIRUS 2 AG (30 MIN TAT): SARS Coronavirus 2 Ag: NEGATIVE

## 2019-11-03 LAB — RESPIRATORY PANEL BY RT PCR (FLU A&B, COVID)
Influenza A by PCR: NEGATIVE
Influenza B by PCR: NEGATIVE
SARS Coronavirus 2 by RT PCR: NEGATIVE

## 2019-11-03 MED ORDER — KETOROLAC TROMETHAMINE 30 MG/ML IJ SOLN
30.0000 mg | Freq: Once | INTRAMUSCULAR | Status: AC
  Administered 2019-11-03: 17:00:00 30 mg via INTRAVENOUS
  Filled 2019-11-03: qty 1

## 2019-11-03 MED ORDER — SODIUM CHLORIDE 0.9 % IV SOLN
INTRAVENOUS | Status: DC | PRN
  Administered 2019-11-03: 500 mL via INTRAVENOUS

## 2019-11-03 MED ORDER — ACETAMINOPHEN 500 MG PO TABS
1000.0000 mg | ORAL_TABLET | Freq: Once | ORAL | Status: AC
  Administered 2019-11-03: 17:00:00 1000 mg via ORAL
  Filled 2019-11-03: qty 2

## 2019-11-03 MED ORDER — ONDANSETRON HCL 4 MG/2ML IJ SOLN
4.0000 mg | Freq: Once | INTRAMUSCULAR | Status: AC
Start: 2019-11-03 — End: 2019-11-03
  Administered 2019-11-03: 17:00:00 4 mg via INTRAVENOUS
  Filled 2019-11-03: qty 2

## 2019-11-03 MED ORDER — IOHEXOL 300 MG/ML  SOLN
100.0000 mL | Freq: Once | INTRAMUSCULAR | Status: AC | PRN
  Administered 2019-11-03: 100 mL via INTRAVENOUS

## 2019-11-03 MED ORDER — SODIUM CHLORIDE 0.9 % IV BOLUS
1000.0000 mL | Freq: Once | INTRAVENOUS | Status: AC
  Administered 2019-11-03: 17:00:00 1000 mL via INTRAVENOUS

## 2019-11-03 MED ORDER — METRONIDAZOLE IN NACL 5-0.79 MG/ML-% IV SOLN
500.0000 mg | Freq: Once | INTRAVENOUS | Status: AC
  Administered 2019-11-03: 18:00:00 500 mg via INTRAVENOUS
  Filled 2019-11-03: qty 100

## 2019-11-03 MED ORDER — SODIUM CHLORIDE 0.9 % IV SOLN
2.0000 g | Freq: Once | INTRAVENOUS | Status: AC
  Administered 2019-11-03: 17:00:00 2 g via INTRAVENOUS
  Filled 2019-11-03: qty 20

## 2019-11-03 NOTE — ED Notes (Signed)
Pt O2 sat 96-99% RA while ambulating

## 2019-11-03 NOTE — ED Provider Notes (Signed)
Care assumed from Tillman, PA-C at shift change with labs and CT pending.  In brief, this patient is a 49 year old male with past medical history of depression, nephrolithiasis who presents today for evaluation of abdominal pain.  He reports he has had diffuse abdominal pain over the last several days with some lower back pain to.  He reports associated nausea but no vomiting.  He states he had a kidney stone in the past and thought this felt similar.  No urinary complaints.  Please see note from previous provider for full history/physical exam.   Physical Exam  BP 118/82 (BP Location: Right Arm)   Pulse 80   Temp 98.1 F (36.7 C) (Oral)   Resp 18   Ht '5\' 7"'  (1.702 m)   Wt 94.8 kg   SpO2 98%   BMI 32.73 kg/m   Physical Exam   Abdomen is soft, nondistended.  No tenderness noted.  Patient is resting comfortably.  No signs of distress.  ED Course/Procedures     Procedures   Results for orders placed or performed during the hospital encounter of 11/03/19 (from the past 24 hour(s))  Urinalysis, Routine w reflex microscopic     Status: None   Collection Time: 11/03/19  4:20 PM  Result Value Ref Range   Color, Urine YELLOW YELLOW   APPearance CLEAR CLEAR   Specific Gravity, Urine 1.025 1.005 - 1.030   pH 6.5 5.0 - 8.0   Glucose, UA NEGATIVE NEGATIVE mg/dL   Hgb urine dipstick NEGATIVE NEGATIVE   Bilirubin Urine NEGATIVE NEGATIVE   Ketones, ur NEGATIVE NEGATIVE mg/dL   Protein, ur NEGATIVE NEGATIVE mg/dL   Nitrite NEGATIVE NEGATIVE   Leukocytes,Ua NEGATIVE NEGATIVE  CBC with Differential     Status: Abnormal   Collection Time: 11/03/19  4:30 PM  Result Value Ref Range   WBC 3.8 (L) 4.0 - 10.5 K/uL   RBC 4.73 4.22 - 5.81 MIL/uL   Hemoglobin 14.3 13.0 - 17.0 g/dL   HCT 42.1 39.0 - 52.0 %   MCV 89.0 80.0 - 100.0 fL   MCH 30.2 26.0 - 34.0 pg   MCHC 34.0 30.0 - 36.0 g/dL   RDW 13.3 11.5 - 15.5 %   Platelets 175 150 - 400 K/uL   nRBC 0.0 0.0 - 0.2 %   Neutrophils  Relative % 52 %   Neutro Abs 1.9 1.7 - 7.7 K/uL   Lymphocytes Relative 39 %   Lymphs Abs 1.5 0.7 - 4.0 K/uL   Monocytes Relative 9 %   Monocytes Absolute 0.3 0.1 - 1.0 K/uL   Eosinophils Relative 0 %   Eosinophils Absolute 0.0 0.0 - 0.5 K/uL   Basophils Relative 0 %   Basophils Absolute 0.0 0.0 - 0.1 K/uL   Immature Granulocytes 0 %   Abs Immature Granulocytes 0.01 0.00 - 0.07 K/uL  Comprehensive metabolic panel     Status: Abnormal   Collection Time: 11/03/19  4:30 PM  Result Value Ref Range   Sodium 137 135 - 145 mmol/L   Potassium 3.2 (L) 3.5 - 5.1 mmol/L   Chloride 101 98 - 111 mmol/L   CO2 25 22 - 32 mmol/L   Glucose, Bld 128 (H) 70 - 99 mg/dL   BUN 25 (H) 6 - 20 mg/dL   Creatinine, Ser 1.13 0.61 - 1.24 mg/dL   Calcium 8.9 8.9 - 10.3 mg/dL   Total Protein 7.0 6.5 - 8.1 g/dL   Albumin 4.1 3.5 - 5.0 g/dL  AST 41 15 - 41 U/L   ALT 44 0 - 44 U/L   Alkaline Phosphatase 48 38 - 126 U/L   Total Bilirubin 0.8 0.3 - 1.2 mg/dL   GFR calc non Af Amer >60 >60 mL/min   GFR calc Af Amer >60 >60 mL/min   Anion gap 11 5 - 15  Lipase, blood     Status: None   Collection Time: 11/03/19  4:30 PM  Result Value Ref Range   Lipase 25 11 - 51 U/L  Lactic acid, plasma     Status: None   Collection Time: 11/03/19  5:05 PM  Result Value Ref Range   Lactic Acid, Venous 0.9 0.5 - 1.9 mmol/L  APTT     Status: None   Collection Time: 11/03/19  5:05 PM  Result Value Ref Range   aPTT 31 24 - 36 seconds  Protime-INR     Status: None   Collection Time: 11/03/19  5:05 PM  Result Value Ref Range   Prothrombin Time 12.9 11.4 - 15.2 seconds   INR 1.0 0.8 - 1.2  SARS Coronavirus 2 Ag (30 min TAT) - Nasal Swab (BD Veritor Kit)     Status: None   Collection Time: 11/03/19  5:05 PM   Specimen: Nasal Swab (BD Veritor Kit)  Result Value Ref Range   SARS Coronavirus 2 Ag NEGATIVE NEGATIVE  Troponin I (High Sensitivity)     Status: None   Collection Time: 11/03/19  5:16 PM  Result Value Ref Range    Troponin I (High Sensitivity) 4 <18 ng/L  Respiratory Panel by RT PCR (Flu A&B, Covid) - Nasopharyngeal Swab     Status: None   Collection Time: 11/03/19  5:57 PM   Specimen: Nasopharyngeal Swab  Result Value Ref Range   SARS Coronavirus 2 by RT PCR NEGATIVE NEGATIVE   Influenza A by PCR NEGATIVE NEGATIVE   Influenza B by PCR NEGATIVE NEGATIVE  Troponin I (High Sensitivity)     Status: None   Collection Time: 11/03/19  7:04 PM  Result Value Ref Range   Troponin I (High Sensitivity) 5 <18 ng/L     MDM   PLAN: Patient pending labs and CT.  MDM:  Troponin is negative.  Lactic acid is unremarkable.  Lipase is unremarkable.  His rapid Covid is negative.  We will plan for the send out Covid.  CBC shows leukopenia of 3.8.  CMP shows potassium of 3.2, BUN of 25, creatinine of 1.13.  UA is negative.  CT on pelvis shows appendix within normal limits.  He has no evidence of obstructing stone but does have small bilateral kidney stones.  No evidence of hydronephrosis.  He does have mention of consolidation and groundglass density in the right lower lobe consistent with pneumonia.  He also has an 11 mm groundglass nodule at the right lung base which could be additional focus of inflammatory pneumonia.  Given symptoms, this could be concerning for Covid pneumonia.  His initial rapid Covid test was negative.  He has a send out Covid test.  His repeat temperature is 97.1.  I ambulated patient in the ED with O2 sats of 96-99% on room air.  On my reevaluation, he is resting comfortably with no signs of distress.  He denies any abdominal pain.  My abdominal exam is benign.  He has been able to tolerate p.o. without any difficulty.  I discussed with him that given his findings on his lung, including groundglass opacity and pulmonary  nodule, this is most likely reflective of COVID-19.  I discussed with him that he needs to follow-up with his primary care doctor for repeat imaging to see if there is resolution  of pulmonary nodule.  I discussed with him that he has a test pending and that if it is positive, he will need to quarantine..  Encouraged at home supportive care measures. At this time, patient exhibits no emergent life-threatening condition that require further evaluation in ED or admission. Discussed patient with Dr. Johnney Killian who is agreeable to plan. Patient had ample opportunity for questions and discussion. All patient's questions were answered with full understanding. Strict return precautions discussed. Patient expresses understanding and agreement to plan.     1. Generalized abdominal pain   2. Suspected COVID-19 virus infection   3. Ground glass opacity present on imaging of lung   4. Pulmonary nodule   5. Fever, unspecified fever cause    Portions of this note were generated with Dragon dictation software. Dictation errors may occur despite best attempts at proofreading.    Volanda Napoleon, PA-C 11/03/19 2326    Charlesetta Shanks, MD 11/04/19 0009

## 2019-11-03 NOTE — Discharge Instructions (Addendum)
As we discussed, your work-up today did not show any kidney stones.  Your blood work was reassuring.  Your CT scan showed a normal appendix.  You did have findings on your lungs that were concerning for COVID-19, including groundglass opacities as well as pulmonary nodules.  This is likely reflective of viral infection.  You will need to follow-up with your primary care doctor to get repeat imaging to ensure that this is improving.  You have a Covid test pending.  Results will come back in a few hours.  You can check on MyChart regarding the results.  You should quarantine until results come back.  If you are positive, you will need to quarantine for 2 weeks.  If you are positive, you need to make sure you are getting plenty of rest, staying hydrated.  You can take Tylenol or ibuprofen for fever.  Return to the emergency department for any difficulty breathing, abdominal pain, vomiting, chest pain or any other worsening or concerning symptoms.  Return the emergency department for any worsening abdominal pain or any other concerning symptoms.

## 2019-11-03 NOTE — ED Triage Notes (Signed)
Pt c/o abd pain, back pain x 1 week-states feels like kidney stone pain-NAD-steady gait

## 2019-11-03 NOTE — ED Provider Notes (Signed)
[facility] EMERGENCY DEPARTMENT Provider Note   CSN: [id number] Arrival date & time: 11/03/19  1548     History Chief Complaint  Patient presents with  . Abdominal Pain    Clayton Bullock is a 49 y.o. male.  HPI   49 year old male with a history of depression, nephrolithiasis, who presents the emergency department today for evaluation of abdominal pain.  States he has had diffuse abdominal pain for the last several days.  It is associated with diffuse back pain as well.  He has had associated nausea with no vomiting.  Denies any diarrhea.  Denies dysuria, frequency, urgency or hematuria.  He denies any fevers, chest pain, cough or known Covid exposures.  States his symptoms feel similar to when he had a kidney stone in the past.  Past Medical History:  Diagnosis Date  . Depression   . Kidney stone     Patient Active Problem List   Diagnosis Date Noted  . 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 EYE SURGERY         Family History  Problem Relation Age of Onset  . Cancer Mother   . Cancer Father   . Diabetes Father   . Heart disease Father     Social History   Tobacco Use  . Smoking status: Former Games developer  . Smokeless tobacco: Never Used  Substance Use Topics  . Alcohol use: No  . Drug use: No    Home Medications Prior to Admission medications   Medication Sig Start Date End Date Taking? Authorizing Provider  hydrochlorothiazide (HYDRODIURIL) 25 MG tablet Take 1 tablet (25 mg total) by mouth daily for 30 days. 12/01/18 12/31/18  Long, Arlyss Repress, MD  Sertraline HCl (ZOLOFT PO) Take by mouth.    [provider]    Allergies    Patient has no known allergies.  Review of Systems   Review of Systems  Constitutional: Negative for fever.  HENT: Negative for ear pain and sore throat.   Eyes: Negative for visual disturbance.  Respiratory: Negative for cough and shortness of breath.     Cardiovascular: Negative for chest pain.  Gastrointestinal: Positive for abdominal pain and nausea. Negative for constipation, diarrhea and vomiting.  Genitourinary: Negative for dysuria, frequency, hematuria and urgency.  Musculoskeletal: Positive for back pain.  Skin: Negative for color change and rash.  Neurological: Negative for headaches.  All other systems reviewed and are negative.   Physical Exam Updated Vital Signs BP 124/67   Pulse 90   Temp (!) 101.8 F (38.8 C) (Rectal)   Resp 17   Ht 5\' 7"  (1.702 m)   Wt 94.8 kg   SpO2 94%   BMI 32.73 kg/m   Physical Exam Vitals and nursing note reviewed.  Constitutional:      Appearance: He is well-developed.  HENT:     Head: Normocephalic and atraumatic.  Eyes:     Conjunctiva/sclera: Conjunctivae normal.  Cardiovascular:     Rate and Rhythm: Normal rate and regular rhythm.     Heart sounds: Normal heart sounds. No murmur.  Pulmonary:     Effort: Pulmonary effort is normal. No respiratory distress.     Breath sounds: Normal breath sounds. No wheezing, rhonchi or rales.  Abdominal:     General: Bowel sounds are normal.     Palpations: Abdomen is soft.     Tenderness: There is abdominal tenderness (generalized TTP, worse to  the RLQ with guarding. ). There is no right CVA tenderness, left CVA tenderness or rebound.  Musculoskeletal:     Cervical back: Neck supple.  Skin:    General: Skin is warm and dry.  Neurological:     Mental Status: He is alert.     ED Results / Procedures / Treatments   Labs (all labs ordered are listed, but only abnormal results are displayed) Labs Reviewed  CBC WITH DIFFERENTIAL/PLATELET - Abnormal; Notable for the following components:      Result Value   WBC 3.8 (*)    All other components within normal limits  COMPREHENSIVE METABOLIC PANEL - Abnormal; Notable for the following components:   Potassium 3.2 (*)    Glucose, Bld 128 (*)    BUN 25 (*)    All other components within  normal limits  URINE CULTURE  CULTURE, BLOOD (ROUTINE X 2)  CULTURE, BLOOD (ROUTINE X 2)  SARS CORONAVIRUS 2 AG (30 MIN TAT)  URINALYSIS, ROUTINE W REFLEX MICROSCOPIC  LIPASE, BLOOD  APTT  PROTIME-INR  LACTIC ACID, PLASMA  LACTIC ACID, PLASMA  TROPONIN I (HIGH SENSITIVITY)    EKG EKG Interpretation  Date/Time:  Friday November 03 2019 17:13:42 EST Ventricular Rate:  87 PR Interval:    QRS Duration: 87 QT Interval:  340 QTC Calculation: 409 R Axis:   54 Text Interpretation: Sinus rhythm no STEMI, t waves diffusely less acute as compared to older tracing Confirmed by Charlesetta Shanks [phone]) on 11/03/2019 5:39:44 PM   Radiology No results found.  Procedures Procedures (including critical care time) CRITICAL CARE Performed by: Rodney Booze   Total critical care time: 31 minutes  Critical care time was exclusive of separately billable procedures and treating other patients.  Critical care was necessary to treat or prevent imminent or life-threatening deterioration.  Critical care was time spent personally by me on the following activities: development of treatment plan with patient and/or surrogate as well as nursing, discussions with consultants, evaluation of patient's response to treatment, examination of patient, obtaining history from patient or surrogate, ordering and performing treatments and interventions, ordering and review of laboratory studies, ordering and review of radiographic studies, pulse oximetry and re-evaluation of patient's condition.    Medications Ordered in ED Medications  cefTRIAXone (ROCEPHIN) 2 g in sodium chloride 0.9 % 100 mL IVPB (2 g Intravenous New Bag/Given 11/03/19 1713)  metroNIDAZOLE (FLAGYL) IVPB 500 mg (has no administration in time range)  ketorolac (TORADOL) 30 MG/ML injection 30 mg (30 mg Intravenous Given 11/03/19 1636)  ondansetron (ZOFRAN) injection 4 mg (4 mg Intravenous Given 11/03/19 1634)  sodium chloride 0.9 % bolus 1,000  mL (1,000 mLs Intravenous New Bag/Given 11/03/19 1634)  acetaminophen (TYLENOL) tablet 1,000 mg (1,000 mg Oral Given 11/03/19 1712)    ED Course  I have reviewed the triage vital signs and the nursing notes.  Pertinent labs & imaging results that were available during my care of the patient were reviewed by me and considered in my medical decision making (see chart for details).    MDM Rules/Calculators/A&P                      49 year old male with history of kidney stones presenting for evaluation of abdominal pain for the last several days.  Is associated with back pain as well.  He feels like it is similar to prior kidney stones.  He denies any urinary symptoms.  On arrival initial oral temp 100.66F, with borderline  tachycardia.  Normal blood pressure and respiratory status. Rectal temp elevated at 101.97F  CBC with leukopenia, no anemia CMP with mild hypokalemia, slightly elevated BUN. Normal kidney and liver fxn.  Lipase wnl Lactic acid is negative Coags wnl UA neg for UTI or blood. Urine culture sent.  Blood cultures sent.  EKG with Sinus rhythm no STEMI, t waves diffusely less acute as compared to older tracing   CT abd/pelvis pending  Pt with fever, HR >90, leukopenia of 3.8, with suspected intraabdominal infection. Code sepsis initiated. 1L IVF ordered and broad spectrum abx ordred to cover intraabdominal source. Pt not hypotensive.   At shift change, care transitioned to Gwendalyn Ege, PA-C with plan to f/u on pending labs and imaging and make appropriate disposition decision.   Final Clinical Impression(s) / ED Diagnoses Final diagnoses:  Sepsis, due to unspecified organism, unspecified whether acute organ dysfunction present [facility])    Rx / DC Orders ED Discharge Orders    None       Karrie Meres, PA-C 11/03/19 1811    Arby Barrette, MD 11/07/19 [phone]

## 2019-11-04 LAB — URINE CULTURE: Culture: NO GROWTH

## 2019-11-08 LAB — CULTURE, BLOOD (ROUTINE X 2)
Culture: NO GROWTH
Culture: NO GROWTH
Special Requests: ADEQUATE
Special Requests: ADEQUATE

## 2020-04-27 ENCOUNTER — Other Ambulatory Visit: Payer: Self-pay

## 2020-04-27 ENCOUNTER — Encounter (HOSPITAL_BASED_OUTPATIENT_CLINIC_OR_DEPARTMENT_OTHER): Payer: Self-pay | Admitting: Emergency Medicine

## 2020-04-27 ENCOUNTER — Emergency Department (HOSPITAL_BASED_OUTPATIENT_CLINIC_OR_DEPARTMENT_OTHER)
Admission: EM | Admit: 2020-04-27 | Discharge: 2020-04-27 | Disposition: A | Discharge status: Home / Self Care | Payer: BC Managed Care – PPO | Attending: Emergency Medicine | Admitting: Emergency Medicine

## 2020-04-27 DIAGNOSIS — I10 Essential (primary) hypertension: Secondary | ICD-10-CM | POA: Diagnosis not present

## 2020-04-27 DIAGNOSIS — R221 Localized swelling, mass and lump, neck: Secondary | ICD-10-CM | POA: Diagnosis not present

## 2020-04-27 DIAGNOSIS — Z87891 Personal history of nicotine dependence: Secondary | ICD-10-CM | POA: Diagnosis not present

## 2020-04-27 HISTORY — DX: Essential (primary) hypertension: I10

## 2020-04-27 NOTE — ED Provider Notes (Signed)
[facility] EMERGENCY DEPARTMENT Provider Note   CSN: [id number] Arrival date & time: 04/27/20  1111     History Chief Complaint  Patient presents with  . neck swelling    Clayton Bullock is a 49 y.o. male presenting for evaluation of right-sided neck swelling.  Patient states when he was looking in the mirror this morning, he thought he saw swelling on the right side.  He states when he pressed on his neck, felt more swollen and on the left side.  He denies pain.  He denies trauma or injury.  He denies recent fevers, chills, ear pain, sore throat, dental pain, cough, cp, or sob.  Patient states several months ago he had sinus surgery, has not had any complications since.  However reports continued intermittent drainage from his sinuses, several days ago he had increased right sided sinus drainage which has since resolved.  He reports a history of high blood pressure which takes medication, no other medical problems.  He has not taken anything for his symptoms.  HPI     Past Medical History:  Diagnosis Date  . Depression   . Hypertension   . Kidney stone     Patient Active Problem List   Diagnosis Date Noted  . 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 EYE SURGERY         Family History  Problem Relation Age of Onset  . Cancer Mother   . Cancer Father   . Diabetes Father   . Heart disease Father     Social History   Tobacco Use  . Smoking status: Former Games developer  . Smokeless tobacco: Never Used  Vaping Use  . Vaping Use: Never used  Substance Use Topics  . Alcohol use: No  . Drug use: No    Home Medications Prior to Admission medications   Medication Sig Start Date End Date Taking? Authorizing Provider  hydrochlorothiazide (HYDRODIURIL) 25 MG tablet Take 1 tablet (25 mg total) by mouth daily for 30 days. 12/01/18 12/31/18  Long, Arlyss Repress, MD  Sertraline HCl (ZOLOFT PO) Take by mouth.    [provider]    Allergies    Patient has no known allergies.  Review of Systems   Review of Systems  HENT:       R sided neck swelling  All other systems reviewed and are negative.   Physical Exam Updated Vital Signs BP 137/78 (BP Location: Left Arm)   Pulse 73   Temp 98.1 F (36.7 C) (Oral)   Resp 18   Ht 5\' 7"  (1.702 m)   Wt 90.7 kg   SpO2 96%   BMI 31.32 kg/m   Physical Exam Vitals and nursing note reviewed.  Constitutional:      General: He is not in acute distress.    Appearance: He is well-developed.     Comments: Sitting comfortably in the bed in no acute distress  HENT:     Head: Normocephalic and atraumatic.     Comments: TMs nonerythematous nonbulging bilaterally.  No significant nasal congestion, mucosal edema, or rhinorrhea.  OP nonerythematous without tonsillar swelling or exudate.  No dental pain or signs of dental abscess. Handling secretions easily.  Airway open and intact Neck:      Comments: No obvious neck swelling on my exam. ?mild R sided cervical LAD.   Cardiovascular:     Rate and Rhythm: Normal rate and regular  rhythm.     Pulses: Normal pulses.  Pulmonary:     Effort: Pulmonary effort is normal. No respiratory distress.     Breath sounds: Normal breath sounds. No stridor. No wheezing or rhonchi.     Comments: Clear lung sounds in all fields Abdominal:     General: There is no distension.  Musculoskeletal:        General: Normal range of motion.     Cervical back: Normal range of motion and neck supple.  Skin:    General: Skin is warm.     Findings: No rash.  Neurological:     Mental Status: He is alert and oriented to person, place, and time.     ED Results / Procedures / Treatments   Labs (all labs ordered are listed, but only abnormal results are displayed) Labs Reviewed - No data to display  EKG None  Radiology No results found.  Procedures Procedures (including critical care time)  Medications Ordered in  ED Medications - No data to display  ED Course  I have reviewed the triage vital signs and the nursing notes.  Pertinent labs & imaging results that were available during my care of the patient were reviewed by me and considered in my medical decision making (see chart for details).    MDM Rules/Calculators/A&P                          Patient presenting for evaluation of right-sided neck swelling.  On my exam, no obvious swelling noted on visual exam.  Maybe some mild right-sided cervical lymphadenopathy, but it is nontender and no sign of airway pectoral involvement.  No clear source of infection at this time, however does patient does report right-sided sinus drainage several days ago.  Discussed likely lymphadenopathy.  Discussed symptomatic treatment Tylenol and ibuprofen, follow-up with PCP if swelling persists.  As he is nontoxic, without fever, without airway involvement, do not believe he needs emergent labs or CT imaging.  At this time, patient appears safe for discharge.  Return precautions given.  Patient states he understands and agrees to plan.  Final Clinical Impression(s) / ED Diagnoses Final diagnoses:  Localized swelling, mass and lump, neck    Rx / DC Orders ED Discharge Orders    None       Alveria Apley, PA-C 04/27/20 1142    Raeford Razor, MD 04/28/20 0730

## 2020-04-27 NOTE — ED Triage Notes (Signed)
Pt reports noticing the R side of his neck is swollen today. Denies pain.

## 2020-04-27 NOTE — Discharge Instructions (Signed)
Take tylenol or ibuprofen as needed for pain or swelling.  Follow up with your primary care doctor if your swelling continues for 2 weeks.  Return to the ER if you develop difficulty breathing, difficulty swallowing, or with any new, worsening, or concerning symptoms.

## 2020-05-29 ENCOUNTER — Emergency Department (HOSPITAL_BASED_OUTPATIENT_CLINIC_OR_DEPARTMENT_OTHER): Payer: BC Managed Care – PPO

## 2020-05-29 ENCOUNTER — Other Ambulatory Visit: Payer: Self-pay

## 2020-05-29 ENCOUNTER — Encounter (HOSPITAL_BASED_OUTPATIENT_CLINIC_OR_DEPARTMENT_OTHER): Payer: Self-pay

## 2020-05-29 ENCOUNTER — Emergency Department (HOSPITAL_BASED_OUTPATIENT_CLINIC_OR_DEPARTMENT_OTHER)
Admission: EM | Admit: 2020-05-29 | Discharge: 2020-05-29 | Disposition: A | Discharge status: Home / Self Care | Payer: BC Managed Care – PPO | Attending: Emergency Medicine | Admitting: Emergency Medicine

## 2020-05-29 DIAGNOSIS — J029 Acute pharyngitis, unspecified: Secondary | ICD-10-CM | POA: Insufficient documentation

## 2020-05-29 DIAGNOSIS — I10 Essential (primary) hypertension: Secondary | ICD-10-CM | POA: Diagnosis not present

## 2020-05-29 DIAGNOSIS — E079 Disorder of thyroid, unspecified: Secondary | ICD-10-CM | POA: Diagnosis present

## 2020-05-29 DIAGNOSIS — Z87891 Personal history of nicotine dependence: Secondary | ICD-10-CM | POA: Insufficient documentation

## 2020-05-29 DIAGNOSIS — M542 Cervicalgia: Secondary | ICD-10-CM | POA: Insufficient documentation

## 2020-05-29 DIAGNOSIS — R52 Pain, unspecified: Secondary | ICD-10-CM

## 2020-05-29 LAB — COMPREHENSIVE METABOLIC PANEL
ALT: 41 U/L (ref 0–44)
AST: 38 U/L (ref 15–41)
Albumin: 4.1 g/dL (ref 3.5–5.0)
Alkaline Phosphatase: 58 U/L (ref 38–126)
Anion gap: 11 (ref 5–15)
BUN: 25 mg/dL — ABNORMAL HIGH (ref 6–20)
CO2: 26 mmol/L (ref 22–32)
Calcium: 9.4 mg/dL (ref 8.9–10.3)
Chloride: 105 mmol/L (ref 98–111)
Creatinine, Ser: 1.05 mg/dL (ref 0.61–1.24)
GFR calc Af Amer: >60 mL/min (ref >60)
GFR calc non Af Amer: >60 mL/min (ref >60)
Glucose, Bld: 100 mg/dL — ABNORMAL HIGH (ref 70–99)
Potassium: 3.1 mmol/L — ABNORMAL LOW (ref 3.5–5.1)
Sodium: 142 mmol/L (ref 135–145)
Total Bilirubin: 0.4 mg/dL (ref 0.3–1.2)
Total Protein: 6.9 g/dL (ref 6.5–8.1)

## 2020-05-29 LAB — CBC WITH DIFFERENTIAL/PLATELET
Abs Immature Granulocytes: 0.02 10*3/uL (ref 0.00–0.07)
Basophils Absolute: 0.1 10*3/uL (ref 0.0–0.1)
Basophils Relative: 1 %
Eosinophils Absolute: 0.3 10*3/uL (ref 0.0–0.5)
Eosinophils Relative: 5 %
HCT: 45.3 % (ref 39.0–52.0)
Hemoglobin: 15.5 g/dL (ref 13.0–17.0)
Immature Granulocytes: 0 %
Lymphocytes Relative: 42 %
Lymphs Abs: 2.6 10*3/uL (ref 0.7–4.0)
MCH: 29.9 pg (ref 26.0–34.0)
MCHC: 34.2 g/dL (ref 30.0–36.0)
MCV: 87.5 fL (ref 80.0–100.0)
Monocytes Absolute: 0.6 10*3/uL (ref 0.1–1.0)
Monocytes Relative: 10 %
Neutro Abs: 2.6 10*3/uL (ref 1.7–7.7)
Neutrophils Relative %: 42 %
Platelets: 237 10*3/uL (ref 150–400)
RBC: 5.18 MIL/uL (ref 4.22–5.81)
RDW: 13.5 % (ref 11.5–15.5)
WBC: 6.1 10*3/uL (ref 4.0–10.5)
nRBC: 0 % (ref 0.0–0.2)

## 2020-05-29 LAB — GROUP A STREP BY PCR: Group A Strep by PCR: NOT DETECTED

## 2020-05-29 MED ORDER — POTASSIUM CHLORIDE CRYS ER 20 MEQ PO TBCR
40.0000 meq | EXTENDED_RELEASE_TABLET | Freq: Once | ORAL | Status: AC
  Administered 2020-05-29: 40 meq via ORAL
  Filled 2020-05-29: qty 2

## 2020-05-29 MED ORDER — IOHEXOL 300 MG/ML  SOLN
75.0000 mL | Freq: Once | INTRAMUSCULAR | Status: AC | PRN
  Administered 2020-05-29: 75 mL via INTRAVENOUS

## 2020-05-29 NOTE — ED Triage Notes (Signed)
Pt c/o anterior neck pain x 1 week-states feels like thyroid pain-states he had [place] of thyroid "years ago" due to swelling-states he was advised "to keep an eye on it"-NAD-steady gait

## 2020-05-29 NOTE — ED Notes (Signed)
Patient transported to CT 

## 2020-05-29 NOTE — Discharge Instructions (Signed)
Your work-up today was overall reassuring with no evidence of acute infection or thyroid nodule on the CT scan of your neck.  Your thyroid labs are in process and we will not complete tonight so please follow-up with your PCP for these results.  He may also use MyChart.  Please rest and stay hydrated.  If any symptoms change or worsen, return to the nearest emergency department.

## 2020-05-29 NOTE — ED Provider Notes (Signed)
[facility] EMERGENCY DEPARTMENT Provider Note   CSN: [id number] Arrival date & time: 05/29/20  1544     History Chief Complaint  Patient presents with  . Thyroid Problem    Clayton Bullock is a 49 y.o. male.  The history is provided by the patient and medical records. No language interpreter was used.  Sore Throat This is a new problem. The current episode started more than 2 days ago. The problem occurs constantly. The problem has not changed since onset.Pertinent negatives include no chest pain, no abdominal pain, no headaches and no shortness of breath. Nothing aggravates the symptoms. Nothing relieves the symptoms. He has tried nothing for the symptoms. The treatment provided no relief.       Past Medical History:  Diagnosis Date  . Depression   . Hypertension   . Kidney stone     Patient Active Problem List   Diagnosis Date Noted  . 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 EYE SURGERY         Family History  Problem Relation Age of Onset  . Cancer Mother   . Cancer Father   . Diabetes Father   . Heart disease Father     Social History   Tobacco Use  . Smoking status: Former Games developer  . Smokeless tobacco: Never Used  Vaping Use  . Vaping Use: Never used  Substance Use Topics  . Alcohol use: No  . Drug use: No    Home Medications Prior to Admission medications   Medication Sig Start Date End Date Taking? Authorizing Provider  hydrochlorothiazide (HYDRODIURIL) 25 MG tablet Take 1 tablet (25 mg total) by mouth daily for 30 days. 12/01/18 12/31/18  Long, Arlyss Repress, MD  Sertraline HCl (ZOLOFT PO) Take by mouth.    [provider]    Allergies    Patient has no known allergies.  Review of Systems   Review of Systems  Constitutional: Negative for chills, diaphoresis, fatigue and fever.  HENT: Positive for sore throat and voice change. Negative for rhinorrhea and trouble  swallowing.   Eyes: Negative for visual disturbance.  Respiratory: Negative for chest tightness and shortness of breath.   Cardiovascular: Negative for chest pain.  Gastrointestinal: Negative for abdominal pain, constipation, diarrhea, nausea and vomiting.  Genitourinary: Negative for flank pain.  Musculoskeletal: Positive for neck pain. Negative for back pain, gait problem and neck stiffness.  Neurological: Negative for light-headedness, numbness and headaches.  Psychiatric/Behavioral: Negative for agitation.  All other systems reviewed and are negative.   Physical Exam Updated Vital Signs BP (!) 142/91 (BP Location: Right Arm)   Pulse 74   Temp 99 F (37.2 C) (Oral)   Resp 20   Ht 5\' 7"  (1.702 m)   Wt 93 kg   SpO2 98%   BMI 32.11 kg/m   Physical Exam Vitals and nursing note reviewed.  Constitutional:      General: He is not in acute distress.    Appearance: He is well-developed. He is not ill-appearing, toxic-appearing or diaphoretic.  HENT:     Head: Normocephalic and atraumatic.     Nose: No congestion or rhinorrhea.     Mouth/Throat:     Mouth: Mucous membranes are moist.     Pharynx: No oropharyngeal exudate or posterior oropharyngeal erythema.  Eyes:     Extraocular Movements: Extraocular movements intact.     Conjunctiva/sclera: Conjunctivae normal.  Pupils: Pupils are equal, round, and reactive to light.  Neck:     Vascular: No carotid bruit.   Cardiovascular:     Rate and Rhythm: Normal rate and regular rhythm.     Pulses: Normal pulses.     Heart sounds: No murmur heard.   Pulmonary:     Effort: Pulmonary effort is normal. No respiratory distress.     Breath sounds: Normal breath sounds. No wheezing, rhonchi or rales.  Chest:     Chest wall: No tenderness.  Abdominal:     General: Abdomen is flat.     Palpations: Abdomen is soft.     Tenderness: There is no abdominal tenderness. There is no right CVA tenderness, left CVA tenderness, guarding or  rebound.  Musculoskeletal:     Cervical back: Normal range of motion and neck supple. Tenderness present.     Right lower leg: No edema.     Left lower leg: No edema.  Lymphadenopathy:     Cervical: No cervical adenopathy.  Skin:    General: Skin is warm and dry.     Capillary Refill: Capillary refill takes less than 2 seconds.     Findings: No erythema.  Neurological:     General: No focal deficit present.     Mental Status: He is alert and oriented to person, place, and time.  Psychiatric:        Mood and Affect: Mood normal.     ED Results / Procedures / Treatments   Labs (all labs ordered are listed, but only abnormal results are displayed) Labs Reviewed  COMPREHENSIVE METABOLIC PANEL - Abnormal; Notable for the following components:      Result Value   Potassium 3.1 (*)    Glucose, Bld 100 (*)    BUN 25 (*)    All other components within normal limits  GROUP A STREP BY PCR  CBC WITH DIFFERENTIAL/PLATELET  TSH  T3, FREE  T4, FREE    EKG None  Radiology CT Soft Tissue Neck W Contrast  Result Date: 05/29/2020 CLINICAL DATA:  Initial evaluation for anterior neck pain for 1 week. EXAM: CT NECK WITH CONTRAST TECHNIQUE: Multidetector CT imaging of the neck was performed using the standard protocol following the bolus administration of intravenous contrast. CONTRAST:  75mL OMNIPAQUE IOHEXOL 300 MG/ML  SOLN COMPARISON:  None available. FINDINGS: Pharynx and larynx: Oral cavity within normal limits without discrete mass or collection. No acute inflammatory changes seen about the dentition. Palatine tonsils symmetric and within normal limits. Few punctate calcified tonsilliths noted bilaterally. No tonsillar or peritonsillar abscess. Parapharyngeal fat maintained. Remainder of the oropharynx and nasopharynx within normal limits. No retropharyngeal swelling or collection. Epiglottis within normal limits. Vallecula clear. Remainder of the hypopharynx and supraglottic larynx within  normal limits. True cords symmetric and normal. Subglottic airway clear. Salivary glands: Salivary glands including the parotid and submandibular glands are within normal limits. Thyroid: Thyroid within normal limits. Lymph nodes: No pathologically enlarged lymph nodes seen within the neck. Vascular: Normal intravascular enhancement seen throughout the neck. Limited intracranial: Unremarkable. Visualized orbits: Visualized globes and orbital soft tissues within normal limits. 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 Visualized paranasal sinuses are clear. Visualized mastoids and middle ear cavities are well pneumatized and free of fluid. Skeleton: No acute osseous abnormality. No discrete or worrisome osseous lesions. Upper chest: Visualized upper chest demonstrates no acute finding. Partially visualized lungs are clear. Other: None. IMPRESSION: Normal CT of the neck. No  acute inflammatory changes or other abnormality identified. Electronically Signed   By: Rise Mu M.D.   On: 05/29/2020 22:45    Procedures Procedures (including critical care time)  Medications Ordered in ED Medications  potassium chloride SA (KLOR-CON) CR tablet 40 mEq (has no administration in time range)  iohexol (OMNIPAQUE) 300 MG/ML solution 75 mL (75 mLs Intravenous Contrast Given 05/29/20 2209)    ED Course  I have reviewed the triage vital signs and the nursing notes.  Pertinent labs & imaging results that were available during my care of the patient were reviewed by me and considered in my medical decision making (see chart for details).    MDM Rules/Calculators/A&P                          Clayton Bullock is a 49 y.o. male with a past medical history significant for hypertension, depression, kidney stones who presents with right anterior neck pain, swelling, and intermittent voice changes.  He reports over the last week, he has had pain in the anterior and  right side of his neck with swelling.  He reports his voice sounded different the other day but has been waxing and waning in this.  He reports no difficulty swallowing or breathing at this time is tolerating his secretions.  He denies fevers or chills but does feel tired at times.  He is very concerned about the deep neck infection.  He reports he was on antibiotics previously for neck infection.  He says that years ago he had an ultrasound of his thyroid which he thinks was abnormal but cannot remember the results.  He was told to "keep an eye on it".  He reports this is been worsening for the last 3 days with the acute pain and swelling.  He reports it is very severe at times but is currently moderate.  On exam, lungs are clear and chest is nontender.  Abdomen is nontender.  Right neck is tender to palpation but he had normal neck range of motion.  Oropharyngeal exam did not show me any evidence of PTA or RPA and he had no nuchal rigidity.  He did not have evidence of Ludwig's angina on my exam.  No stridor was appreciated.  No bruit.  He did have tenderness and some swelling on his right anterior neck.  No posterior neck tenderness.  Exam otherwise unremarkable.  Had a shared decision made conversation with patient and he is very concerned wanting to get imaging of his neck to rule out a deep infection.  With his reported voice changes, his fatigue, and the right neck pain and swelling, will get a CT to rule out abscess or infection.  We will also get some thyroid test given his history of thyroid problems and family history of thyroid problems.  These tests will likely not result tonight but may be helpful for PCP for further management.  If there is no significant unreality seen, dissipate discharge with likely viral syndrome causing right sided neck adenopathy and pain.  Anticipate reassessment after imaging and labs.   Patient's labs was significant for hypokalemia which is had in the past.  Oral  potassium given.  Otherwise labs are reassuring in regards to CBC and rest of CMP.  TSH and T3/T4 are still in process.  Patient will follow up in these results with his PCP and with my chart.  His CT scan did not show any acute amount of  the neck.  Given his well appearance for over 7/2 hours, feel he safe to discharge home for outpatient follow-up.  He agreed with plan of care and appeared well.  Patient discharged in good condition for outpatient follow-up and follow-up on the thyroid tests.     Final Clinical Impression(s) / ED Diagnoses Final diagnoses:  Pain  Neck pain    Rx / DC Orders ED Discharge Orders    None      Clinical Impression: 1. Neck pain   2. Pain     Disposition: Discharge  Condition: Good  I have discussed the results, Dx and Tx plan with the pt(& family if present). He/she/they expressed understanding and agree(s) with the plan. Discharge instructions discussed at great length. Strict return precautions discussed and pt &/or family have verbalized understanding of the instructions. No further questions at time of discharge.    New Prescriptions   No medications on file    Follow Up: Darryl Lent, PA-C [address] [place] [address] [phone]     [facility] [address] [id number] [address] [place] [address] [phone]       Diesha Rostad, Canary Brim, MD 05/29/20 2329

## 2020-05-30 LAB — T4, FREE: Free T4: 1.23 ng/dL — ABNORMAL HIGH (ref 0.61–1.12)

## 2020-05-30 LAB — TSH: TSH: 3.559 u[IU]/mL (ref 0.350–4.500)

## 2020-05-31 LAB — T3, FREE: T3, Free: 4.1 pg/mL (ref 2.0–4.4)

## 2020-10-04 ENCOUNTER — Emergency Department (HOSPITAL_BASED_OUTPATIENT_CLINIC_OR_DEPARTMENT_OTHER)
Admission: EM | Admit: 2020-10-04 | Discharge: 2020-10-04 | Disposition: A | Discharge status: Home / Self Care | Payer: BC Managed Care – PPO | Attending: Emergency Medicine | Admitting: Emergency Medicine

## 2020-10-04 ENCOUNTER — Other Ambulatory Visit: Payer: Self-pay

## 2020-10-04 ENCOUNTER — Encounter (HOSPITAL_BASED_OUTPATIENT_CLINIC_OR_DEPARTMENT_OTHER): Payer: Self-pay | Admitting: *Deleted

## 2020-10-04 ENCOUNTER — Emergency Department (HOSPITAL_BASED_OUTPATIENT_CLINIC_OR_DEPARTMENT_OTHER): Payer: BC Managed Care – PPO

## 2020-10-04 DIAGNOSIS — I10 Essential (primary) hypertension: Secondary | ICD-10-CM | POA: Diagnosis not present

## 2020-10-04 DIAGNOSIS — Z87891 Personal history of nicotine dependence: Secondary | ICD-10-CM | POA: Insufficient documentation

## 2020-10-04 DIAGNOSIS — R109 Unspecified abdominal pain: Secondary | ICD-10-CM | POA: Diagnosis present

## 2020-10-04 DIAGNOSIS — Z79899 Other long term (current) drug therapy: Secondary | ICD-10-CM | POA: Diagnosis not present

## 2020-10-04 LAB — URINALYSIS, ROUTINE W REFLEX MICROSCOPIC
Bilirubin Urine: NEGATIVE
Glucose, UA: NEGATIVE mg/dL
Hgb urine dipstick: NEGATIVE
Ketones, ur: NEGATIVE mg/dL
Leukocytes,Ua: NEGATIVE
Nitrite: NEGATIVE
Protein, ur: NEGATIVE mg/dL
Specific Gravity, Urine: >1.03 — ABNORMAL HIGH (ref 1.005–1.030)
pH: 6 (ref 5.0–8.0)

## 2020-10-04 LAB — CBC WITH DIFFERENTIAL/PLATELET
Abs Immature Granulocytes: 0.02 10*3/uL (ref 0.00–0.07)
Basophils Absolute: 0 10*3/uL (ref 0.0–0.1)
Basophils Relative: 1 %
Eosinophils Absolute: 0.2 10*3/uL (ref 0.0–0.5)
Eosinophils Relative: 4 %
HCT: 42.6 % (ref 39.0–52.0)
Hemoglobin: 15 g/dL (ref 13.0–17.0)
Immature Granulocytes: 0 %
Lymphocytes Relative: 40 %
Lymphs Abs: 2.5 10*3/uL (ref 0.7–4.0)
MCH: 30.2 pg (ref 26.0–34.0)
MCHC: 35.2 g/dL (ref 30.0–36.0)
MCV: 85.7 fL (ref 80.0–100.0)
Monocytes Absolute: 0.6 10*3/uL (ref 0.1–1.0)
Monocytes Relative: 9 %
Neutro Abs: 2.9 10*3/uL (ref 1.7–7.7)
Neutrophils Relative %: 46 %
Platelets: 248 10*3/uL (ref 150–400)
RBC: 4.97 MIL/uL (ref 4.22–5.81)
RDW: 13.2 % (ref 11.5–15.5)
WBC: 6.2 10*3/uL (ref 4.0–10.5)
nRBC: 0 % (ref 0.0–0.2)

## 2020-10-04 LAB — BASIC METABOLIC PANEL
Anion gap: 8 (ref 5–15)
BUN: 24 mg/dL — ABNORMAL HIGH (ref 6–20)
CO2: 24 mmol/L (ref 22–32)
Calcium: 9.5 mg/dL (ref 8.9–10.3)
Chloride: 109 mmol/L (ref 98–111)
Creatinine, Ser: 0.97 mg/dL (ref 0.61–1.24)
GFR, Estimated: >60 mL/min (ref >60)
Glucose, Bld: 113 mg/dL — ABNORMAL HIGH (ref 70–99)
Potassium: 3.4 mmol/L — ABNORMAL LOW (ref 3.5–5.1)
Sodium: 141 mmol/L (ref 135–145)

## 2020-10-04 MED ORDER — PREDNISONE 50 MG PO TABS: 50.0000 mg | ORAL_TABLET | Freq: Every day | ORAL | 0 refills | Status: AC

## 2020-10-04 NOTE — ED Triage Notes (Signed)
Bilateral sharp groin pain since yesterday.  Lower back pain radiating to his sides for 5 weeks.  Denies nausea.  Seen his urologist yesterday (-) for renal stones per urologist.

## 2020-10-04 NOTE — ED Notes (Signed)
Patient has hx of kidney stones, and has had intermittent bilateral flank and lower abdominal pain for 5 weeks, states the passed 1 stone a few weeks ago.   Saw his urologist yesterday and they did a UA and KUB and were trying to set up outpatient CT scan, today pain was constant all day long and patient decided he couldn't wait until then.

## 2020-10-04 NOTE — ED Provider Notes (Signed)
[facility] EMERGENCY DEPARTMENT Provider Note   CSN: [id number] Arrival date & time: 10/04/20  1513     History Chief Complaint  Patient presents with  . Abdominal Pain  . Groin Pain  . Back Pain    Clayton Bullock is a 49 y.o. male.  HPI   Patient with significant medical history of hypertension, kidney stones presents to the emergency department with chief complaint of bilateral flank pain and groin pain.  Patient states flank pain has been going on for the last 5 weeks but over the last few days pain has gotten significantly worse.  Describes a sharp rating sensation from his left and right upper flanks going down into his groins.  He endorses associated urinary frequency but denies dysuria, hematuria, penile discharge, testicular pain, abdominal pain nausea or vomiting.  He endorses that he went to his urologist yesterday where they checked his urine and performed a plain abdominal x-ray which not reveal any acute findings.  He supposed to go to a CT renal but unfortunate this was going to take over a week's time. states pain increases with movement, states generally hurts more in the mornings will ease off and then comes back during the day.  He denies  alleviating factors.  He has no  abdominal history.  Patient denies headaches, fevers, chills, shortness of breath, chest pain, abdominal pain, nausea, vomit, diarrhea, pedal edema.  Past Medical History:  Diagnosis Date  . Depression   . Hypertension   . Kidney stone     Patient Active Problem List   Diagnosis Date Noted  . 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 EYE SURGERY         Family History  Problem Relation Age of Onset  . Cancer Mother   . Cancer Father   . Diabetes Father   . Heart disease Father     Social History   Tobacco Use  . Smoking status: Former Games developer  . Smokeless tobacco: Never Used  Vaping Use  . Vaping Use: Never used   Substance Use Topics  . Alcohol use: No  . Drug use: No    Home Medications Prior to Admission medications   Medication Sig Start Date End Date Taking? Authorizing Provider  hydrochlorothiazide (HYDRODIURIL) 25 MG tablet Take 1 tablet (25 mg total) by mouth daily for 30 days. 12/01/18 12/31/18 Yes Long, Arlyss Repress, MD  predniSONE (DELTASONE) 50 MG tablet Take 1 tablet (50 mg total) by mouth daily for 5 days. 10/04/20 10/09/20  Carroll Sage, PA-C    Allergies    Patient has no known allergies.  Review of Systems   Review of Systems  Constitutional: Negative for chills and fever.  HENT: Negative for congestion.   Respiratory: Negative for shortness of breath.   Cardiovascular: Negative for chest pain.  Gastrointestinal: Negative for abdominal pain, diarrhea, nausea and vomiting.  Genitourinary: Positive for frequency. Negative for dysuria, enuresis, scrotal swelling and testicular pain.  Musculoskeletal: Negative for back pain.  Skin: Negative for rash.  Neurological: Negative for headaches.  Hematological: Does not bruise/bleed easily.    Physical Exam Updated Vital Signs BP 137/84 (BP Location: Right Arm)   Pulse 74   Temp 98.2 F (36.8 C) (Oral)   Resp 18   Ht 5\' 7"  (1.702 m)   Wt 92.5 kg   SpO2 99%   BMI 31.95 kg/m   Physical Exam Vitals and nursing  note reviewed.  Constitutional:      General: He is not in acute distress.    Appearance: He is not ill-appearing.  HENT:     Head: Normocephalic and atraumatic.     Nose: No congestion.  Eyes:     Conjunctiva/sclera: Conjunctivae normal.  Cardiovascular:     Rate and Rhythm: Normal rate and regular rhythm.     Pulses: Normal pulses.     Heart sounds: No murmur heard. No friction rub. No gallop.   Pulmonary:     Effort: No respiratory distress.     Breath sounds: No wheezing, rhonchi or rales.  Abdominal:     Palpations: Abdomen is soft.     Tenderness: There is abdominal tenderness. There is no right  CVA tenderness or left CVA tenderness.     Comments: Patient's abdomen was nondistended, normoactive bowel sounds, dull to percussion.  He had noted right and left flank pain, negative rebound tenderness, no peritoneal sign noted.  Musculoskeletal:     Right lower leg: No edema.     Left lower leg: No edema.  Skin:    General: Skin is warm and dry.  Neurological:     Mental Status: He is alert.  Psychiatric:        Mood and Affect: Mood normal.     ED Results / Procedures / Treatments   Labs (all labs ordered are listed, but only abnormal results are displayed) Labs Reviewed  BASIC METABOLIC PANEL - Abnormal; Notable for the following components:      Result Value   Potassium 3.4 (*)    Glucose, Bld 113 (*)    BUN 24 (*)    All other components within normal limits  URINALYSIS, ROUTINE W REFLEX MICROSCOPIC - Abnormal; Notable for the following components:   Specific Gravity, Urine >1.030 (*)    All other components within normal limits  CBC WITH DIFFERENTIAL/PLATELET    EKG None  Radiology CT Renal Stone Study  Result Date: 10/04/2020 CLINICAL DATA:  Intermittent flank pain EXAM: CT ABDOMEN AND PELVIS WITHOUT CONTRAST TECHNIQUE: Multidetector CT imaging of the abdomen and pelvis was performed following the standard protocol without IV contrast. COMPARISON:  11/03/2019, radiograph 10/03/2020, CT 06/14/2018 FINDINGS: Lower chest: Lung bases are clear. Hepatobiliary: No focal liver abnormality is seen. No gallstones, gallbladder wall thickening, or biliary dilatation. Pancreas: Unremarkable. No pancreatic ductal dilatation or surrounding inflammatory changes. Spleen: Normal in size without focal abnormality. Adrenals/Urinary Tract: Adrenal glands are normal. Kidneys show no hydronephrosis. 5 mm stone lower pole right kidney. No ureteral stone. The bladder is unremarkable. Small hypodense lesion in the upper pole right kidney, lack of contrast limits further evaluation.  Stomach/Bowel: Stomach is within normal limits. Appendix appears normal. No evidence of bowel wall thickening, distention, or inflammatory changes. Vascular/Lymphatic: Nonaneurysmal aorta. Scattered nonenlarged central mesenteric nodes. Reproductive: Prostate calcification Other: No free air or free fluid. Small fat containing inguinal hernias. Mild soft tissue stranding within the central mesentery, not significantly changed since 11/03/2019, new as compared with 2018. Musculoskeletal: No acute or significant osseous findings. IMPRESSION: 1. Nonobstructing stone in the right kidney. Negative for ureteral stone. 2. Mild soft tissue stranding and haziness within the central mesentery, nonspecific, could be secondary to mesenteritis or panniculitis. Electronically Signed   By: Jasmine Pang M.D.   On: 10/04/2020 19:52    Procedures Procedures (including critical care time)  Medications Ordered in ED Medications - No data to display  ED Course  I have reviewed the  triage vital signs and the nursing notes.  Pertinent labs & imaging results that were available during my care of the patient were reviewed by me and considered in my medical decision making (see chart for details).    MDM Rules/Calculators/A&P                          Patient presents with bilateral flank pain and frequent urination.  He did not appear acute distress, vital signs reassuring due to patient's history of frequent kidney stones and bilateral flank pain will obtain CT renal for further evaluation.  CBC negative for leukocytosis or signs anemia, BMP shows slight hypokalemia 3.4, no metabolic acidosis, hyperglycemia of 113, elevated BUN of 24, no AKI, no anion gap present.  UA negative for nitrates, leukocytes, hematuria.  CT renal shows nonobstructing stone in right kidney, mild soft tissue stranding in haziness within the central mesentery nonspecific could be secondary to mesenteritis or pannicultis   Low suspicion for  systemic infection as patient is nontoxic-appearing, vital signs reassuring, no obvious source infection on my exam.  Low suspicion for UTI or pyelonephritis as UA unremarkable for infection.  Low suspicion for kidney stones as CT imaging does not reveal any acute findings.  Low suspicion for epididymitis, STIs as patient denies testicular pain and/or penile discharge.  Low suspicion for stomach ulcer as patient had no epigastric pain, denies increased pain with food, has low risk factors.  Suspect patient may be suffering from inflammation of his mesentery.  Will start him a short course of steroids and have him follow-up with GI for further evaluation.  Vital signs have remained stable, no indication for hospital admission.  Patient discussed with attending and they agreed with assessment and plan.  Patient given at home care as well strict return precautions.  Patient verbalized that they understood agreed to said plan.   Final Clinical Impression(s) / ED Diagnoses Final diagnoses:  Flank pain    Rx / DC Orders ED Discharge Orders         Ordered    predniSONE (DELTASONE) 50 MG tablet  Daily        10/04/20 2036           Carroll Sage, PA-C 10/04/20 2300    Terald Sleeper, MD 10/05/20 1109

## 2020-10-04 NOTE — Discharge Instructions (Signed)
Seen here for flank pain.  Lab work all looks reassuring.  Imaging shows that you have some inflammation within your mesentery could be secondary to mesentertris or pannicultis.  I have started you on steroids please take as prescribed.  You may also take ibuprofen every 6 as needed please call dosing the back of bottle.  I want you to follow-up with your GI doctor for further evaluation.  Given you the contact information.  Come back to the emergency department if you develop chest pain, shortness of breath, severe abdominal pain, uncontrolled nausea, vomiting, diarrhea.

## 2020-10-26 IMAGING — CT CT NECK W/ CM
3 of 4 series · 12 of 33 positions shown, 14 images · IV contrast (Omnipaque)
Comparison: None available.

CLINICAL DATA: Initial evaluation for anterior neck pain for 1
week.

EXAM:
CT NECK WITH CONTRAST
TECHNIQUE: Multidetector CT imaging of the neck was performed using the
standard protocol following the bolus administration of intravenous
contrast.
CONTRAST:  75mL OMNIPAQUE IOHEXOL 300 MG/ML  SOLN

[Series 5: sag neck · sagittal · 0.46mm/px · 5 of 97 slices shown, 6 images]
[im 33/97  bone]
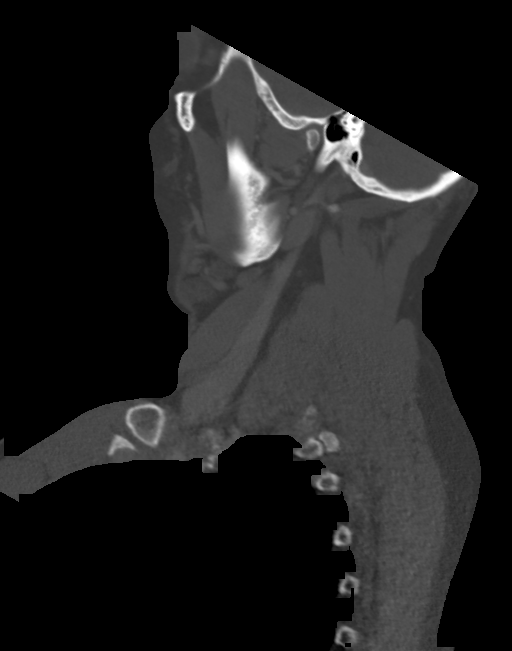
[im 41/97  bone]
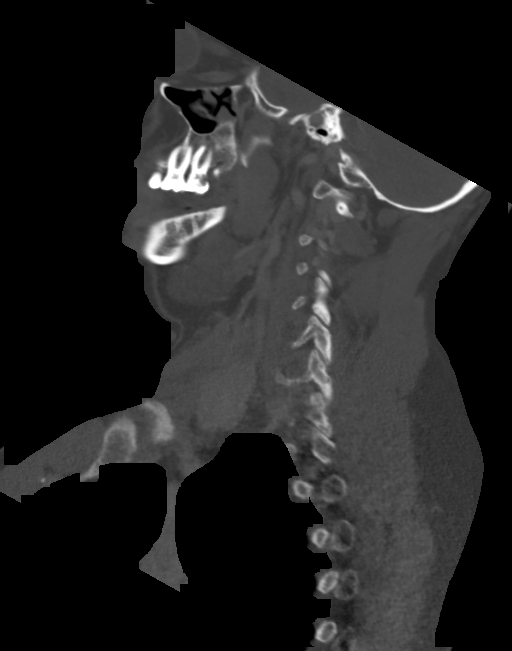
[im 49/97  soft-tissue]
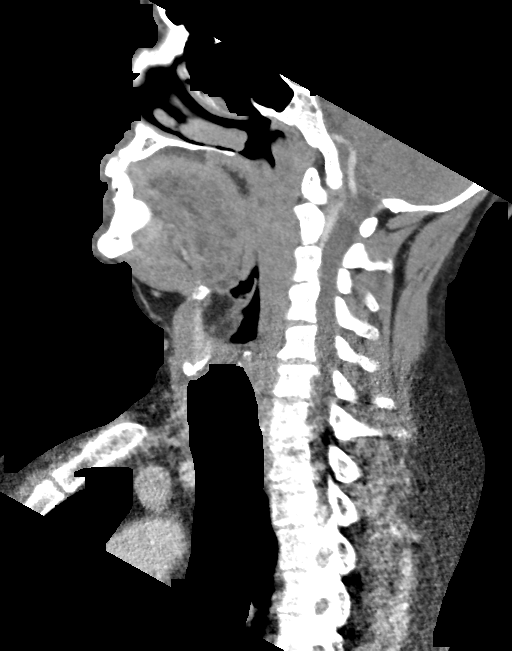
[im 49/97  bone]
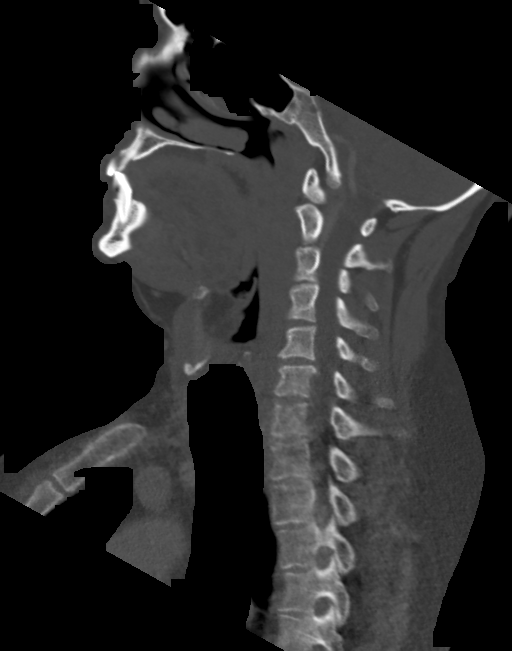
[im 57/97  bone]
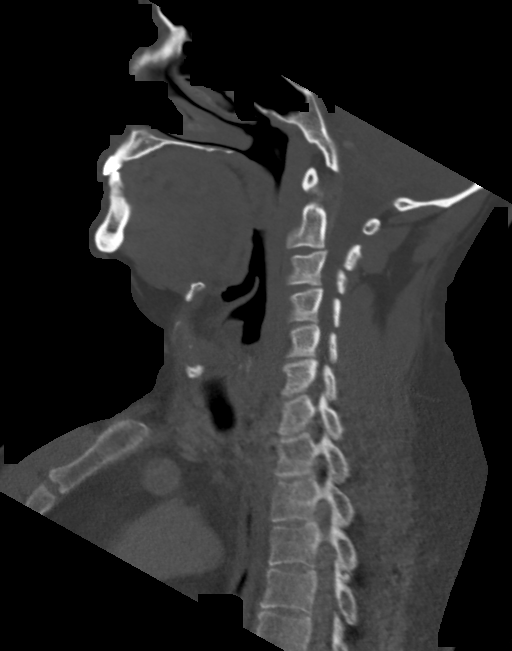
[im 65/97  bone]
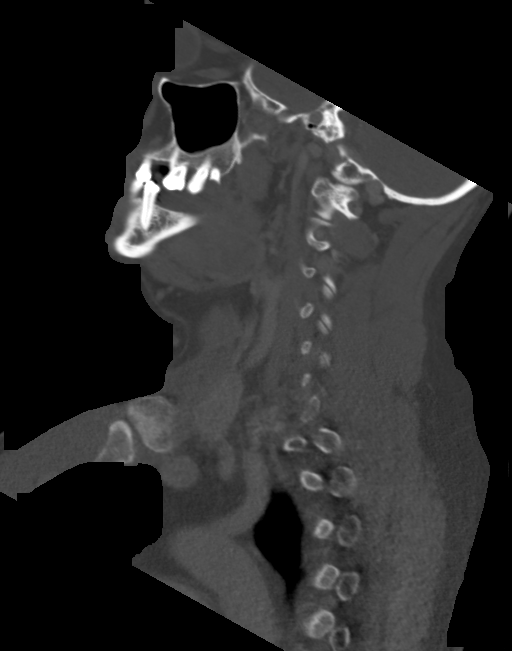

[Series 6: cor neck · coronal · 0.47mm/px · 3 of 97 slices shown]
[im 34/97  bone]
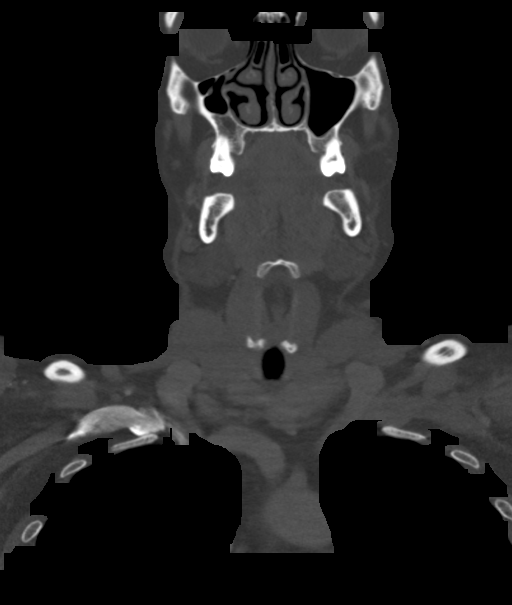
[im 44/97  bone]
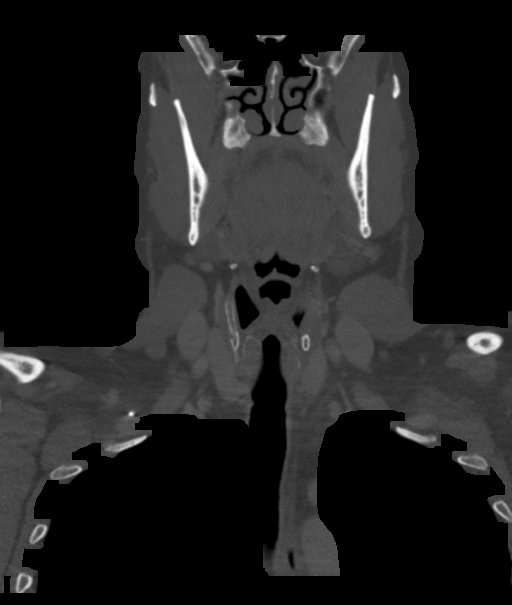
[im 54/97  bone]
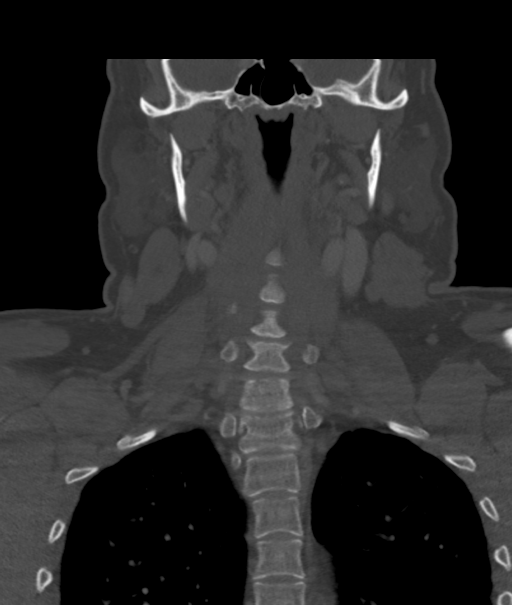

[Series 7: orthogonal ax · axial · 0.37mm/px · z∈[+689,+872]mm · 4 of 147 slices shown, 5 images]
[im 21/147  soft-tissue]
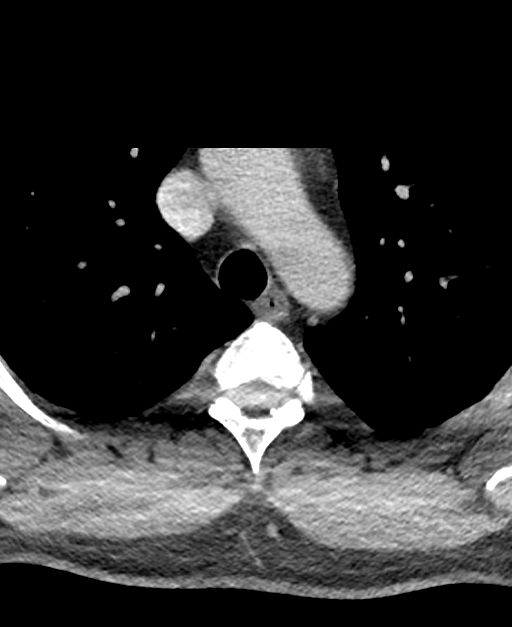
[im 21/147  bone]
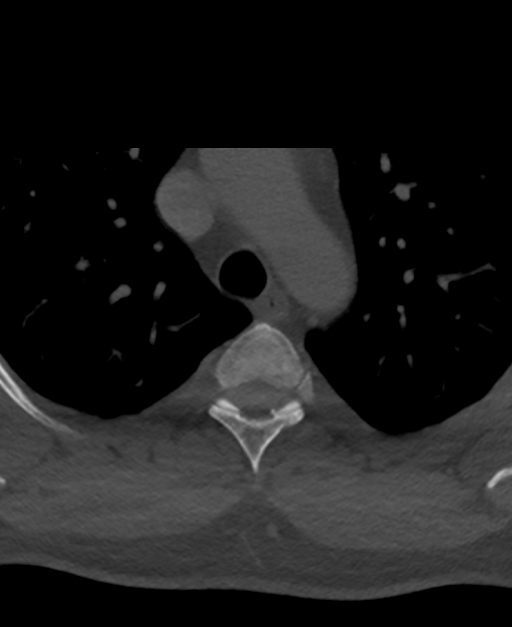
[im 63/147  bone]
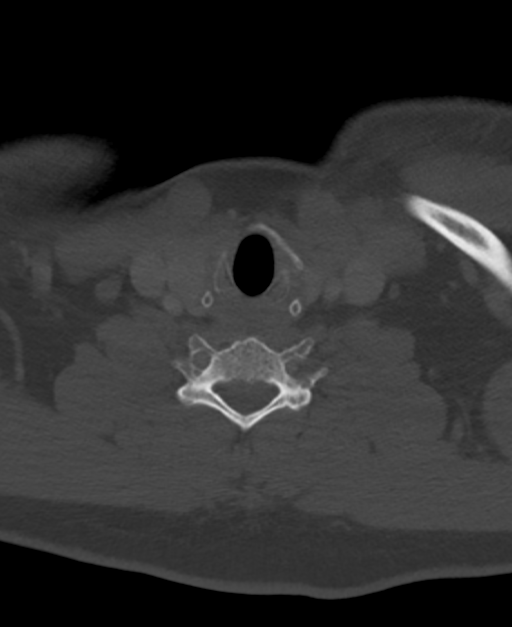
[im 84/147  bone]
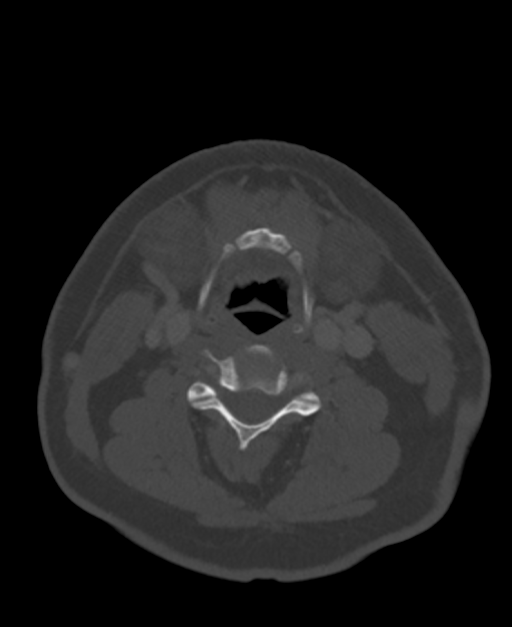
[im 126/147  bone]
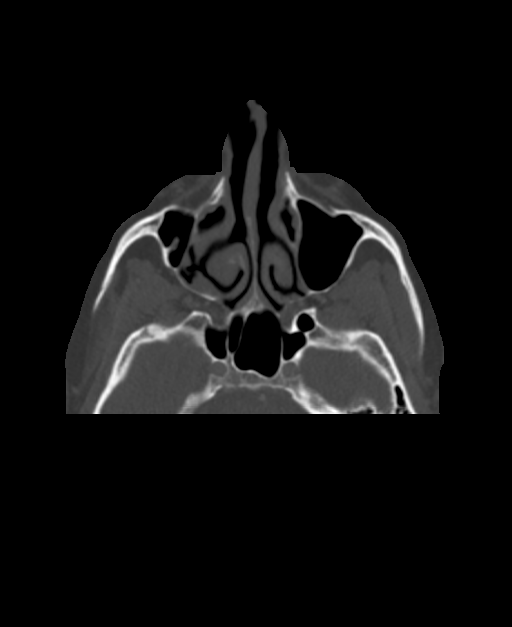

[12 of 33 positions shown; findings below may reference images not displayed]

FINDINGS: Pharynx and larynx: Oral cavity within normal limits without
discrete mass or collection. No acute inflammatory changes seen
about the dentition. Palatine tonsils symmetric and within normal
limits. Few punctate calcified tonsilliths noted bilaterally. No
tonsillar or peritonsillar abscess. Parapharyngeal fat maintained.
Remainder of the oropharynx and nasopharynx within normal limits. No
retropharyngeal swelling or collection. Epiglottis within normal
limits. Vallecula clear. Remainder of the hypopharynx and
supraglottic larynx within normal limits. True cords symmetric and
normal. Subglottic airway clear.

Salivary glands: Salivary glands including the parotid and
submandibular glands are within normal limits.

Thyroid: Thyroid within normal limits.

Lymph nodes: No pathologically enlarged lymph nodes seen within the
neck.

Vascular: Normal intravascular enhancement seen throughout the neck.

Limited intracranial: Unremarkable.

Visualized orbits: Visualized globes and orbital soft tissues within
normal limits.

Mastoids and visualized paranasal sinuses: Visualized right
maxillary sinus is hypoplastic. Visualized paranasal sinuses are
clear. Visualized mastoids and middle ear cavities are well
pneumatized and free of fluid.

Skeleton: No acute osseous abnormality. No discrete or worrisome
osseous lesions.

Upper chest: Visualized upper chest demonstrates no acute finding.
Partially visualized lungs are clear.

Other: None.
IMPRESSION: Normal CT of the neck. No acute inflammatory changes or other
abnormality identified.

## 2020-11-08 ENCOUNTER — Ambulatory Visit: Payer: BC Managed Care – PPO | Admitting: Gastroenterology

## 2020-11-14 ENCOUNTER — Other Ambulatory Visit: Payer: Self-pay | Admitting: Gastroenterology

## 2020-11-14 DIAGNOSIS — R109 Unspecified abdominal pain: Secondary | ICD-10-CM

## 2020-11-27 ENCOUNTER — Ambulatory Visit
Admission: RE | Admit: 2020-11-27 | Discharge: 2020-11-27 | Disposition: A | Discharge status: Home / Self Care | Payer: BC Managed Care – PPO | Source: Ambulatory Visit | Attending: Gastroenterology | Admitting: Gastroenterology

## 2020-11-27 DIAGNOSIS — R109 Unspecified abdominal pain: Secondary | ICD-10-CM

## 2020-11-27 MED ORDER — IOPAMIDOL (ISOVUE-300) INJECTION 61%
100.0000 mL | Freq: Once | INTRAVENOUS | Status: AC | PRN
  Administered 2020-11-27: 100 mL via INTRAVENOUS

## 2021-07-12 ENCOUNTER — Emergency Department (HOSPITAL_BASED_OUTPATIENT_CLINIC_OR_DEPARTMENT_OTHER)
Admission: EM | Admit: 2021-07-12 | Discharge: 2021-07-12 | Disposition: A | Discharge status: Home / Self Care | Payer: BC Managed Care – PPO | Attending: Emergency Medicine | Admitting: Emergency Medicine

## 2021-07-12 ENCOUNTER — Other Ambulatory Visit: Payer: Self-pay

## 2021-07-12 ENCOUNTER — Encounter (HOSPITAL_BASED_OUTPATIENT_CLINIC_OR_DEPARTMENT_OTHER): Payer: Self-pay | Admitting: Emergency Medicine

## 2021-07-12 DIAGNOSIS — I10 Essential (primary) hypertension: Secondary | ICD-10-CM | POA: Diagnosis not present

## 2021-07-12 DIAGNOSIS — Z79899 Other long term (current) drug therapy: Secondary | ICD-10-CM | POA: Insufficient documentation

## 2021-07-12 DIAGNOSIS — R0981 Nasal congestion: Secondary | ICD-10-CM | POA: Diagnosis present

## 2021-07-12 DIAGNOSIS — U071 COVID-19: Secondary | ICD-10-CM | POA: Diagnosis not present

## 2021-07-12 DIAGNOSIS — Z87891 Personal history of nicotine dependence: Secondary | ICD-10-CM | POA: Diagnosis not present

## 2021-07-12 NOTE — ED Triage Notes (Signed)
Pt reports sneezing and nasal congestion since yesterday

## 2021-07-12 NOTE — ED Provider Notes (Signed)
[facility] EMERGENCY DEPARTMENT Provider Note   CSN: [id number] Arrival date & time: 07/12/21  1535     History Chief Complaint  Patient presents with   Nasal Congestion    Clayton Bullock is a 50 y.o. male with history of seasonal allergies who presents with nasal congestion and sneezing x1 day.  Patient has been taking over-the-counter Allegra, and intermittently using prescribed Flonase.  He also used a sinus cleaning device similar to a Nettie pot earlier today.  He states that his nose feels itchy, also reports some ear pressure.  He denies fevers, chills, chest pain, shortness of breath, nausea, vomiting, abdominal pain.  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 Authorizing Provider  hydrochlorothiazide (HYDRODIURIL) 25 MG tablet Take 1 tablet (25 mg total) by mouth daily for 30 days. 12/01/18 12/31/18  Long, Arlyss Repress, MD    Allergies    Patient has no known allergies.  Review of Systems   Review of Systems  Constitutional:  Negative for chills and fever.  HENT:  Positive for congestion and sneezing.   Respiratory:  Negative for cough and shortness of breath.   Cardiovascular:  Negative for chest pain.  Gastrointestinal:  Negative for abdominal pain and nausea.  All other systems reviewed and are negative.  Physical  Exam Updated Vital Signs BP 127/65 (BP Location: Right Arm)   Pulse (!) 56   Temp 98.2 F (36.8 C) (Oral)   Resp 17   Ht 5\' 7"  (1.702 m)   Wt 90.7 kg   SpO2 100%   BMI 31.32 kg/m   Physical Exam Vitals and nursing note reviewed.  Constitutional:      Appearance: Normal appearance.  HENT:     Head: Normocephalic and atraumatic.     Right Ear: Tympanic membrane normal.     Left Ear: Tympanic membrane normal.     Nose: Congestion present.     Mouth/Throat:     Mouth: Mucous membranes are moist.     Pharynx: Oropharynx is clear.  Eyes:     Conjunctiva/sclera: Conjunctivae normal.  Cardiovascular:     Rate and Rhythm: Normal rate and regular rhythm.  Pulmonary:     Effort: Pulmonary effort is normal. No respiratory distress.     Breath sounds: Normal breath sounds.  Abdominal:     General: There is no distension.     Palpations: Abdomen is soft.     Tenderness: There is no abdominal tenderness.  Skin:    General: Skin is warm and dry.  Neurological:     General: No focal deficit present.     Mental Status: He is  alert.    ED Results / Procedures / Treatments   Labs (all labs ordered are listed, but only abnormal results are displayed) Labs Reviewed  SARS CORONAVIRUS 2 (TAT 6-24 HRS)    EKG None  Radiology No results found.  Procedures Procedures   Medications Ordered in ED Medications - No data to display  ED Course  I have reviewed the triage vital signs and the nursing notes.  Pertinent labs & imaging results that were available during my care of the patient were reviewed by me and considered in my medical decision making (see chart for details).    MDM Rules/Calculators/A&P                           Patient is otherwise healthy 50 year old male with a history of seasonal allergies who presents with nasal congestion and sneezing x1 day.  Patient has been taking Allegra and intermittently using Flonase.  He states that his nose feels itchy.  On  exam patient is afebrile oxygen saturation 100% on room air. He is not in acute distress.  Lung sounds are clear to auscultation bilaterally.  He has nasal congestion, oropharynx is moist without erythema or exudate.  Patient's symptoms likely due to allergic rhinitis or viral upper respiratory infection.  We tested for flu and COVID, results are pending.  Patient has not required admission or inpatient treatment for symptoms at this time.  Patient is not a candidate for antiviral treatment at this time, and he does not have underlying lung disease that would make me want to wait for a positive COVID test.    Patient can discharge home with plan to continue allergy medication, adding Benadryl at night.  Discussed reasons to return to the emergency department.  Patient agreeable to plan.  Final Clinical Impression(s) / ED Diagnoses Final diagnoses:  Nasal congestion    Rx / DC Orders ED Discharge Orders     None        Jeanella Flattery 07/12/21 1939    Charlynne Pander, MD 07/13/21 1623

## 2021-07-12 NOTE — ED Notes (Signed)
Patient reports nasal congestion x 1 day

## 2021-07-12 NOTE — Discharge Instructions (Addendum)
Your evaluated emergency department today for congestion.  As we discussed this is most likely due to a viral infection or allergies.  Both of which do not require antibiotics.  We have tested you for COVID and flu, this should result within the next day or so.  You should receive a call with results or you can check on MyChart.  If this is positive please follow CDC guidelines for social isolation.  I would continue wearing your mask at work until your test results.  I would recommend continuing your allergy medicine, using your flonase daily, and adding on benadryl at night.  I would recommend following up with your primary doctor to discuss long-term management of your allergies, including possible changes in medications.  Continue to monitor how you are doing and return to the emergency department for new or worsening symptoms such as difficulty breathing.

## 2021-07-13 LAB — SARS CORONAVIRUS 2 (TAT 6-24 HRS): SARS Coronavirus 2: POSITIVE — AB

## 2021-10-13 ENCOUNTER — Other Ambulatory Visit: Payer: Self-pay

## 2021-10-13 ENCOUNTER — Encounter (HOSPITAL_BASED_OUTPATIENT_CLINIC_OR_DEPARTMENT_OTHER): Payer: Self-pay | Admitting: Emergency Medicine

## 2021-10-13 ENCOUNTER — Emergency Department (HOSPITAL_BASED_OUTPATIENT_CLINIC_OR_DEPARTMENT_OTHER)
Admission: EM | Admit: 2021-10-13 | Discharge: 2021-10-13 | Disposition: A | Discharge status: Home / Self Care | Payer: BC Managed Care – PPO | Attending: Emergency Medicine | Admitting: Emergency Medicine

## 2021-10-13 DIAGNOSIS — Z79899 Other long term (current) drug therapy: Secondary | ICD-10-CM | POA: Insufficient documentation

## 2021-10-13 DIAGNOSIS — Z87891 Personal history of nicotine dependence: Secondary | ICD-10-CM | POA: Insufficient documentation

## 2021-10-13 DIAGNOSIS — M542 Cervicalgia: Secondary | ICD-10-CM | POA: Insufficient documentation

## 2021-10-13 DIAGNOSIS — I10 Essential (primary) hypertension: Secondary | ICD-10-CM | POA: Diagnosis not present

## 2021-10-13 NOTE — Discharge Instructions (Signed)
Call your primary care doctor or specialist as discussed in the next 2-3 days.   Return immediately back to the ER if:  Your symptoms worsen within the next 12-24 hours. You develop new symptoms such as new fevers, persistent vomiting, new pain, shortness of breath, or new weakness or numbness, or if you have any other concerns.  

## 2021-10-13 NOTE — ED Triage Notes (Signed)
Reports pain in his neck that he reports is coming from his thyroid.  Been going on for a week.  Denies any other symptoms.

## 2021-10-13 NOTE — ED Provider Notes (Signed)
[facility] EMERGENCY DEPARTMENT Provider Note   CSN: [id number] Arrival date & time: 10/13/21  8416     History Chief Complaint  Patient presents with   Neck Pain    Clayton Bullock is a 50 y.o. male.  ContinuePatient presents chief complaint of right-sided anterior neck pain.  He points to his right sternocleidomastoid muscle and states that its been tender there for the past week.  He denies any fall or trauma.  He denies any fevers or cough or vomiting or diarrhea.  He states has had this happen before in the past he saw his primary care doctor and was told he may have a problem with his thyroid.  Since then every time he has neck pain is concerned he may be a thyroid related issue.  He was seen in the ER last year for similar symptoms had CT scan done showing normal-sized thyroid to follow-up with his doctor.  He had recurrence of symptoms again for the past week and presents to the ER.  Otherwise denies fevers or cough no vomiting or diarrhea no chest pain or shortness of breath.  Denies any new numbness or weakness.      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 Authorizing Provider  hydrochlorothiazide (HYDRODIURIL) 25 MG tablet Take 1 tablet (25 mg total) by mouth daily for 30 days. 12/01/18 12/31/18  Long, Arlyss Repress, MD    Allergies     Patient has no known allergies.  Review of Systems   Review of Systems  Constitutional:  Negative for fever.  HENT:  Negative for ear pain and sore throat.   Eyes:  Negative for pain.  Respiratory:  Negative for cough.   Cardiovascular:  Negative for chest pain.  Gastrointestinal:  Negative for abdominal pain.  Genitourinary:  Negative for flank pain.  Musculoskeletal:  Negative for back pain.  Skin:  Negative for color change and rash.  Neurological:  Negative for syncope.  All other systems reviewed and are negative.  Physical Exam Updated Vital Signs BP (!) 157/92 (BP Location: Right Arm)    Pulse 70    Temp 98.1 F (36.7 C) (Oral)    Resp 18    Ht 5\' 7"  (1.702 m)    Wt 90.7 kg    SpO2 100%    BMI 31.32 kg/m   Physical Exam Constitutional:      Appearance: He is well-developed.  HENT:     Head: Normocephalic.     Nose: Nose normal.  Eyes:     Extraocular Movements: Extraocular movements intact.  Neck:     Vascular: No carotid bruit.     Comments: Patient ranging  his neck fully left and right and forward flexing and extending his neck with full normal range of motion without pain or discomfort. Cardiovascular:     Rate and Rhythm: Normal rate.     Comments: No carotid bruits noted. Pulmonary:     Effort: Pulmonary effort is normal.  Musculoskeletal:     Cervical back: Normal range of motion and neck supple. No rigidity or tenderness.  Lymphadenopathy:     Cervical: No cervical adenopathy.  Skin:    Coloration: Skin is not jaundiced.  Neurological:     General: No focal deficit present.     Mental Status: He is alert. Mental status is at baseline.     Comments: No facial droop or facial muscle weakness noted.  Cranial nerves II to XII intact.  Strength 5/5 all extremities.  Distal radial pulses are 2+ and normal bilaterally.    ED Results / Procedures / Treatments   Labs (all labs ordered are listed, but only abnormal results are displayed) Labs Reviewed - No  data to display  EKG None  Radiology No results found.  Procedures Procedures   Medications Ordered in ED Medications - No data to display  ED Course  I have reviewed the triage vital signs and the nursing notes.  Pertinent labs & imaging results that were available during my care of the patient were reviewed by me and considered in my medical decision making (see chart for details).    MDM Rules/Calculators/A&P                         Patient states that he is taking thyroid testing.  However I advised him that this is best done by his primary care doctor who can follow-up on the results.  Vital signs remained stable.  He had a neck CT done last year that was unremarkable with no masses or lesions noted.  On exam there is no palpable mass or lesion no abnormality no tenderness on exam no abnormal warmth.  I doubt infectious etiology no evidence of cellulitis no palpable mass noted.  Carotid etiology considered, however no heart signs or carotid injury noted.  Neurovascular intact otherwise with no bruits noted.  EKG shows sinus rhythm normal rate no ST elevations or depressions noted.  No T wave inversions noted.  Will recommend close outpatient follow-up with his doctor within the week.  Advising immediate return for worsening symptoms or any additional concerns.    Final Clinical Impression(s) / ED Diagnoses Final diagnoses:  Neck pain    Rx / DC Orders ED Discharge Orders     None        Cheryll Cockayne, MD 10/13/21 [phone]

## 2022-01-09 ENCOUNTER — Emergency Department (HOSPITAL_BASED_OUTPATIENT_CLINIC_OR_DEPARTMENT_OTHER)
Admission: EM | Admit: 2022-01-09 | Discharge: 2022-01-09 | Disposition: A | Discharge status: Home / Self Care | Payer: BC Managed Care – PPO | Attending: Emergency Medicine | Admitting: Emergency Medicine

## 2022-01-09 ENCOUNTER — Other Ambulatory Visit: Payer: Self-pay

## 2022-01-09 ENCOUNTER — Encounter (HOSPITAL_BASED_OUTPATIENT_CLINIC_OR_DEPARTMENT_OTHER): Payer: Self-pay

## 2022-01-09 DIAGNOSIS — R55 Syncope and collapse: Secondary | ICD-10-CM | POA: Diagnosis present

## 2022-01-09 DIAGNOSIS — W1839XA Other fall on same level, initial encounter: Secondary | ICD-10-CM | POA: Insufficient documentation

## 2022-01-09 LAB — URINALYSIS, ROUTINE W REFLEX MICROSCOPIC
Bilirubin Urine: NEGATIVE
Glucose, UA: NEGATIVE mg/dL
Hgb urine dipstick: NEGATIVE
Ketones, ur: NEGATIVE mg/dL
Leukocytes,Ua: NEGATIVE
Nitrite: NEGATIVE
Protein, ur: NEGATIVE mg/dL
Specific Gravity, Urine: >=1.03 (ref 1.005–1.030)
pH: 5.5 (ref 5.0–8.0)

## 2022-01-09 LAB — BASIC METABOLIC PANEL
Anion gap: 10 (ref 5–15)
BUN: 29 mg/dL — ABNORMAL HIGH (ref 6–20)
CO2: 25 mmol/L (ref 22–32)
Calcium: 9.7 mg/dL (ref 8.9–10.3)
Chloride: 104 mmol/L (ref 98–111)
Creatinine, Ser: 1.03 mg/dL (ref 0.61–1.24)
GFR, Estimated: >60 mL/min (ref >60)
Glucose, Bld: 111 mg/dL — ABNORMAL HIGH (ref 70–99)
Potassium: 3.3 mmol/L — ABNORMAL LOW (ref 3.5–5.1)
Sodium: 139 mmol/L (ref 135–145)

## 2022-01-09 LAB — CBC
HCT: 41.5 % (ref 39.0–52.0)
Hemoglobin: 14.8 g/dL (ref 13.0–17.0)
MCH: 31.2 pg (ref 26.0–34.0)
MCHC: 35.7 g/dL (ref 30.0–36.0)
MCV: 87.6 fL (ref 80.0–100.0)
Platelets: 231 10*3/uL (ref 150–400)
RBC: 4.74 MIL/uL (ref 4.22–5.81)
RDW: 13.4 % (ref 11.5–15.5)
WBC: 7.2 10*3/uL (ref 4.0–10.5)
nRBC: 0 % (ref 0.0–0.2)

## 2022-01-09 LAB — CBG MONITORING, ED: Glucose-Capillary: 119 mg/dL — ABNORMAL HIGH (ref 70–99)

## 2022-01-09 NOTE — ED Triage Notes (Signed)
Pt arrives ambulatory to ED with reports of having syncopal episode this morning while going to bathroom around 3 am today. States that he woke up lying across the tub. Also states that he was bleeding above his left eye. Pt states he rested today while waiting for his wife to get off work and bring him to the doctor. States that he went to [facility] but was sent to ED.  ?

## 2022-01-09 NOTE — Discharge Instructions (Signed)
Please follow-up with your primary care doctor.  Discuss your episode with them.  Come back to ER if you have any additional episodes of passing out, any chest pain or difficulty in breathing or other new concerning symptom. ?

## 2022-01-10 NOTE — ED Provider Notes (Signed)
?[facility] EMERGENCY DEPARTMENT ?Provider Note ? ? ?CSN: [id number] ?Arrival date & time: 01/09/22  1601 ? ?  ? ?History ? ?Chief Complaint  ?Patient presents with  ? Loss of Consciousness  ? ? ?Clayton Bullock is a 51 y.o. male.  Presented to the emergency department with concern for LOC.  Patient reports that around 3 AM he was going to the bathroom, tried to have a bowel movement when he passed out.  States that he had fallen to the ground.  Also thought he noted slight amount of bleeding above his left eyebrow.  This stopped.  Says that since this time he is felt completely normal.  No further episodes of passing out.  No lightheadedness, chest pain, difficulty breathing. ? ?He denies any major medical problems.  Went to urgent care, advised to come to ER for further assessment. ? ?I reviewed urgent care note through care everywhere, they advised patient come to ER for syncope work-up. ? ?HPI ? ?  ? ?Home Medications ?Prior to Admission medications   ?Medication Sig Start Date End Date Taking? Authorizing Provider  ?hydrochlorothiazide (HYDRODIURIL) 25 MG tablet Take 1 tablet (25 mg total) by mouth daily for 30 days. 12/01/18 12/31/18  Long, Arlyss Repress, MD  ?   ? ?Allergies    ?Patient has no known allergies.   ? ?Review of Systems   ?Review of Systems  ?Constitutional:  Negative for chills, fatigue and fever.  ?HENT:  Negative for ear pain and sore throat.   ?Eyes:  Negative for pain and visual disturbance.  ?Respiratory:  Negative for cough and shortness of breath.   ?Cardiovascular:  Negative for chest pain and palpitations.  ?Gastrointestinal:  Negative for abdominal pain and vomiting.  ?Genitourinary:  Negative for dysuria and hematuria.  ?Musculoskeletal:  Negative for arthralgias and back pain.  ?Skin:  Negative for color change and rash.  ?Neurological:  Positive for syncope. Negative for seizures.  ?All other systems reviewed and are negative. ? ?Physical Exam ?Updated Vital Signs ?BP (!) 141/90  (BP Location: Right Arm)   Pulse 65   Temp 98.7 ?F (37.1 ?C) (Oral)   Resp 18   Ht 5\' 7"  (1.702 m)   Wt 91.2 kg   SpO2 100%   BMI 31.48 kg/m?  ?Physical Exam ?Vitals and nursing note reviewed.  ?Constitutional:   ?   General: He is not in acute distress. ?   Appearance: He is well-developed.  ?HENT:  ?   Head: Normocephalic and atraumatic.  ?Eyes:  ?   Conjunctiva/sclera: Conjunctivae normal.  ?Cardiovascular:  ?   Rate and Rhythm: Normal rate and regular rhythm.  ?   Heart sounds: No murmur heard. ?Pulmonary:  ?   Effort: Pulmonary effort is normal. No respiratory distress.  ?   Breath sounds: Normal breath sounds.  ?Abdominal:  ?   Palpations: Abdomen is soft.  ?   Tenderness: There is no abdominal tenderness.  ?Musculoskeletal:     ?   General: No swelling or deformity.  ?   Cervical back: Neck supple.  ?Skin: ?   General: Skin is warm and dry.  ?   Capillary Refill: Capillary refill takes less than 2 seconds.  ?Neurological:  ?   Mental Status: He is alert.  ?Psychiatric:     ?   Mood and Affect: Mood normal.  ? ? ?ED Results / Procedures / Treatments   ?Labs ?(all labs ordered are listed, but only abnormal results are displayed) ?Labs  Reviewed  ?BASIC METABOLIC PANEL - Abnormal; Notable for the following components:  ?    Result Value  ? Potassium 3.3 (*)   ? Glucose, Bld 111 (*)   ? BUN 29 (*)   ? All other components within normal limits  ?CBG MONITORING, ED - Abnormal; Notable for the following components:  ? Glucose-Capillary 119 (*)   ? All other components within normal limits  ?CBC  ?URINALYSIS, ROUTINE W REFLEX MICROSCOPIC  ? ? ?EKG ?EKG Interpretation ? ?Date/Time:  Friday January 09 2022 16:17:27 EDT ?Ventricular Rate:  54 ?PR Interval:  156 ?QRS Duration: 92 ?QT Interval:  404 ?QTC Calculation: 383 ?R Axis:   56 ?Text Interpretation: Sinus bradycardia with sinus arrhythmia Otherwise normal ECG When compared with ECG of 13-Oct-2021 09:50, PREVIOUS ECG IS PRESENT Confirmed by Marianna Fuss  [phone]) on 01/09/2022 7:51:02 PM ? ?Radiology ?No results found. ? ?Procedures ?Procedures  ? ? ?Medications Ordered in ED ?Medications - No data to display ? ?ED Course/ Medical Decision Making/ A&P ?  ?                        ?Medical Decision Making ?Amount and/or Complexity of Data Reviewed ?Labs: ordered. ? ? ?A 51 year old gentleman presents to ER after he had a syncopal episode after attempting to have bowel movement this morning.  He has had no ongoing symptoms since that time.  He does not have any visible trauma on inspection of his head.  His basic labs are normal, no anemia or electrolyte derangement.  His EKG was reviewed, normal intervals, no acute changes noted.  Given the work-up, no ongoing symptoms, stable vitals, appropriate for DC.  Advise follow-up with a PCP. ? ? ? ?After the discussed management above, the patient was determined to be safe for discharge.  The patient was in agreement with this plan and all questions regarding their care were answered.  ED return precautions were discussed and the patient will return to the ED with any significant worsening of condition. ? ? ? ? ? ? ? ? ?Final Clinical Impression(s) / ED Diagnoses ?Final diagnoses:  ?Syncope and collapse  ? ? ?Rx / DC Orders ?ED Discharge Orders   ? ? None  ? ?  ? ? ?  ?Milagros Loll, MD ?01/10/22 1523 ? ?

## 2022-08-05 ENCOUNTER — Other Ambulatory Visit: Payer: Self-pay

## 2022-08-05 ENCOUNTER — Encounter (HOSPITAL_BASED_OUTPATIENT_CLINIC_OR_DEPARTMENT_OTHER): Payer: Self-pay | Admitting: Emergency Medicine

## 2022-08-05 ENCOUNTER — Emergency Department (HOSPITAL_BASED_OUTPATIENT_CLINIC_OR_DEPARTMENT_OTHER): Payer: BC Managed Care – PPO

## 2022-08-05 ENCOUNTER — Emergency Department (HOSPITAL_BASED_OUTPATIENT_CLINIC_OR_DEPARTMENT_OTHER)
Admission: EM | Admit: 2022-08-05 | Discharge: 2022-08-05 | Disposition: A | Discharge status: Home / Self Care | Payer: BC Managed Care – PPO | Attending: Emergency Medicine | Admitting: Emergency Medicine

## 2022-08-05 DIAGNOSIS — Z79899 Other long term (current) drug therapy: Secondary | ICD-10-CM | POA: Insufficient documentation

## 2022-08-05 DIAGNOSIS — M542 Cervicalgia: Secondary | ICD-10-CM | POA: Diagnosis present

## 2022-08-05 DIAGNOSIS — Z85850 Personal history of malignant neoplasm of thyroid: Secondary | ICD-10-CM | POA: Insufficient documentation

## 2022-08-05 LAB — CBC WITH DIFFERENTIAL/PLATELET
Abs Immature Granulocytes: 0.02 10*3/uL (ref 0.00–0.07)
Basophils Absolute: 0 10*3/uL (ref 0.0–0.1)
Basophils Relative: 1 %
Eosinophils Absolute: 0.2 10*3/uL (ref 0.0–0.5)
Eosinophils Relative: 4 %
HCT: 40.8 % (ref 39.0–52.0)
Hemoglobin: 14.4 g/dL (ref 13.0–17.0)
Immature Granulocytes: 0 %
Lymphocytes Relative: 32 %
Lymphs Abs: 2 10*3/uL (ref 0.7–4.0)
MCH: 30.4 pg (ref 26.0–34.0)
MCHC: 35.3 g/dL (ref 30.0–36.0)
MCV: 86.1 fL (ref 80.0–100.0)
Monocytes Absolute: 0.6 10*3/uL (ref 0.1–1.0)
Monocytes Relative: 9 %
Neutro Abs: 3.4 10*3/uL (ref 1.7–7.7)
Neutrophils Relative %: 54 %
Platelets: 230 10*3/uL (ref 150–400)
RBC: 4.74 MIL/uL (ref 4.22–5.81)
RDW: 13.2 % (ref 11.5–15.5)
WBC: 6.3 10*3/uL (ref 4.0–10.5)
nRBC: 0 % (ref 0.0–0.2)

## 2022-08-05 LAB — COMPREHENSIVE METABOLIC PANEL
ALT: 37 U/L (ref 0–44)
AST: 29 U/L (ref 15–41)
Albumin: 4.1 g/dL (ref 3.5–5.0)
Alkaline Phosphatase: 51 U/L (ref 38–126)
Anion gap: 6 (ref 5–15)
BUN: 22 mg/dL — ABNORMAL HIGH (ref 6–20)
CO2: 26 mmol/L (ref 22–32)
Calcium: 9.1 mg/dL (ref 8.9–10.3)
Chloride: 106 mmol/L (ref 98–111)
Creatinine, Ser: 1.01 mg/dL (ref 0.61–1.24)
GFR, Estimated: >60 mL/min (ref >60)
Glucose, Bld: 89 mg/dL (ref 70–99)
Potassium: 3.5 mmol/L (ref 3.5–5.1)
Sodium: 138 mmol/L (ref 135–145)
Total Bilirubin: 0.5 mg/dL (ref 0.3–1.2)
Total Protein: 6.9 g/dL (ref 6.5–8.1)

## 2022-08-05 MED ORDER — IOHEXOL 300 MG/ML  SOLN
75.0000 mL | Freq: Once | INTRAMUSCULAR | Status: AC | PRN
  Administered 2022-08-05: 75 mL via INTRAVENOUS

## 2022-08-05 MED ORDER — MORPHINE SULFATE (PF) 4 MG/ML IV SOLN
4.0000 mg | Freq: Once | INTRAVENOUS | Status: AC
  Administered 2022-08-05: 4 mg via INTRAVENOUS
  Filled 2022-08-05: qty 1

## 2022-08-05 MED ORDER — CYCLOBENZAPRINE HCL 5 MG PO TABS: 5.0000 mg | ORAL_TABLET | Freq: Three times a day (TID) | ORAL | 0 refills | Status: DC | PRN

## 2022-08-05 NOTE — ED Triage Notes (Signed)
Sharp intermittent left sided neck pain that started today while at work. Had left thyroid glad removed 11/2021. Denies mass in area, cp, shob, dizziness, difficulty breathing, throat swelling.

## 2022-08-05 NOTE — ED Provider Notes (Signed)
[facility] Provider Note   CSN: [id number] Arrival date & time: 08/05/22  1445     History  Chief Complaint  Patient presents with   Neck Pain    Clayton Bullock is a 51 y.o. male history of thyroid cancer status post thyroidectomy, here presenting with neck pain.  Patient states that today at work, he noticed pain in the left side of his neck.  He denies any heavy lifting.  He denies any sore throat or dental pain.  He is concerned that his cancer has recurred.  He is TSH was normal about a month ago.  Patient denies any trauma or injury.  The history is provided by the patient.       Home Medications Prior to Admission medications   Medication Sig Start Date End Date Taking? Authorizing Provider  hydrochlorothiazide (HYDRODIURIL) 25 MG tablet Take 1 tablet (25 mg total) by mouth daily for 30 days. 12/01/18 12/31/18  Long, Wonda Olds, MD      Allergies    Molds & smuts    Review of Systems   Review of Systems  Musculoskeletal:  Positive for neck pain.  All other systems reviewed and are negative.   Physical Exam Updated Vital Signs BP 133/76 (BP Location: Right Arm)   Pulse 70   Temp 98.4 F (36.9 C) (Oral)   Resp 20   Ht 5\' 7"  (1.702 m)   Wt 90.7 kg   SpO2 98%   BMI 31.32 kg/m  Physical Exam Vitals and nursing note reviewed.  Constitutional:      Appearance: Normal appearance.  HENT:     Head: Normocephalic.     Nose: Nose normal.     Mouth/Throat:     Mouth: Mucous membranes are moist.  Eyes:     Extraocular Movements: Extraocular movements intact.     Pupils: Pupils are equal, round, and reactive to light.  Neck:     Comments: Mild tenderness over the left SCM.  Surgical scar in the lower part of the neck is healing well.  There is no obvious large mass in the neck. Cardiovascular:     Rate and Rhythm: Normal rate and regular rhythm.     Pulses: Normal pulses.     Heart sounds: Normal heart sounds.  Pulmonary:      Effort: Pulmonary effort is normal.     Breath sounds: Normal breath sounds.  Abdominal:     General: Abdomen is flat.  Musculoskeletal:        General: Normal range of motion.  Skin:    General: Skin is warm.     Capillary Refill: Capillary refill takes less than 2 seconds.  Neurological:     General: No focal deficit present.     Mental Status: He is alert and oriented to person, place, and time.  Psychiatric:        Mood and Affect: Mood normal.        Behavior: Behavior normal.     ED Results / Procedures / Treatments   Labs (all labs ordered are listed, but only abnormal results are displayed) Labs Reviewed  CBC WITH DIFFERENTIAL/PLATELET  COMPREHENSIVE METABOLIC PANEL    EKG EKG Interpretation  Date/Time:  Wednesday August 05 2022 15:13:14 EDT Ventricular Rate:  60 PR Interval:  162 QRS Duration: 90 QT Interval:  402 QTC Calculation: 402 R Axis:   44 Text Interpretation: Normal sinus rhythm Cannot rule out Anterior infarct , age undetermined Abnormal ECG  When compared with ECG of 09-Jan-2022 16:17, PREVIOUS ECG IS PRESENT Confirmed by Richardean Canal [phone]) on 08/05/2022 3:31:50 PM  Radiology No results found.  Procedures Procedures    Medications Ordered in ED Medications  morphine (PF) 4 MG/ML injection 4 mg (4 mg Intravenous Given 08/05/22 1555)    ED Course/ Medical Decision Making/ A&P                           Medical Decision Making Clayton Bullock is a 51 y.o. male here presenting with left-sided neck pain.  I think likely muscle strain.  However given his history of thyroid cancer, we will get a CT to rule out recurrent cancer.  He has no obvious dental infection on clinical exam.   5:26 PM I reviewed patient's labs and independently interpreted CT scan.  There is no recurrent mass or abscess.  I think likely muscle strain.  Will discharge home with some muscle relaxants.  Problems Addressed: Neck pain: acute illness or injury  Amount and/or  Complexity of Data Reviewed Labs: ordered. Decision-making details documented in ED Course. Radiology: ordered and independent interpretation performed. Decision-making details documented in ED Course.  Risk Prescription drug management.    Final Clinical Impression(s) / ED Diagnoses Final diagnoses:  None    Rx / DC Orders ED Discharge Orders     None         Charlynne Pander, MD 08/05/22 1726

## 2022-08-05 NOTE — Discharge Instructions (Signed)
As we discussed, your CT scan today did not show any recurrent mass. You likely have muscle strain. Take motrin for pain and flexeril for muscle spasms   See your doctor for follow up   Return to ER if you have worse neck pain, trouble swallowing

## 2022-11-26 ENCOUNTER — Other Ambulatory Visit: Payer: Self-pay

## 2022-11-26 ENCOUNTER — Encounter (HOSPITAL_BASED_OUTPATIENT_CLINIC_OR_DEPARTMENT_OTHER): Payer: Self-pay | Admitting: Emergency Medicine

## 2022-11-26 ENCOUNTER — Emergency Department (HOSPITAL_BASED_OUTPATIENT_CLINIC_OR_DEPARTMENT_OTHER): Payer: BC Managed Care – PPO

## 2022-11-26 ENCOUNTER — Emergency Department (HOSPITAL_BASED_OUTPATIENT_CLINIC_OR_DEPARTMENT_OTHER)
Admission: EM | Admit: 2022-11-26 | Discharge: 2022-11-26 | Disposition: A | Discharge status: Home / Self Care | Payer: BC Managed Care – PPO | Attending: Emergency Medicine | Admitting: Emergency Medicine

## 2022-11-26 DIAGNOSIS — M545 Low back pain, unspecified: Secondary | ICD-10-CM | POA: Insufficient documentation

## 2022-11-26 DIAGNOSIS — M549 Dorsalgia, unspecified: Secondary | ICD-10-CM | POA: Diagnosis present

## 2022-11-26 LAB — URINALYSIS, ROUTINE W REFLEX MICROSCOPIC
Bilirubin Urine: NEGATIVE
Glucose, UA: NEGATIVE mg/dL
Hgb urine dipstick: NEGATIVE
Ketones, ur: NEGATIVE mg/dL
Leukocytes,Ua: NEGATIVE
Nitrite: NEGATIVE
Protein, ur: NEGATIVE mg/dL
Specific Gravity, Urine: 1.01 (ref 1.005–1.030)
pH: 5.5 (ref 5.0–8.0)

## 2022-11-26 NOTE — Discharge Instructions (Addendum)
Please see your PCP if symptoms persist Tylenol and Ibuprofen alternating for the pain Take it easy at work

## 2022-11-26 NOTE — ED Triage Notes (Signed)
Lower and upper back, side and hip pain since yesterday.  No known injury.  No known fever.  No dysuria.  Hx of kidney stones.

## 2022-11-26 NOTE — ED Provider Notes (Signed)
[facility] Provider Note   CSN: [id number] Arrival date & time: 11/26/22  1010     History  Chief Complaint  Patient presents with   Back Pain   Hip Pain    Clayton Bullock is a 52 y.o. male.  Patient presents for middle back/flank pain bilaterally but more prevalent on the L that began yesterday morning around 1000.  Patient has a h/o lithotripsy in the right kidney for renal stones x 2.  He reports that there is no trauma to cause the onset of pain, no fall.  Patient denies dysuria, urinary frequency, hematuria, fever, chills, chest pain, shortness of breath.  The pain is intermittent, not currently present.  He does say that he feels as if the pain is similar to his previous kidney stones.  No bowel or bladder incontinence, no radiculopathy, no saddle paresthesias.        Home Medications Prior to Admission medications   Medication Sig Start Date End Date Taking? Authorizing Provider  amLODipine (NORVASC) 5 MG tablet Take 1 tablet by mouth daily. 11/11/22  Yes [provider]  metoprolol succinate (TOPROL-XL) 50 MG 24 hr tablet TAKE ONE TABLET BY MOUTH TWICE A DAY IN THE MORNING AND AT BEDTIME 08/10/22  Yes [provider]  hydrochlorothiazide (HYDRODIURIL) 25 MG tablet Take 1 tablet (25 mg total) by mouth daily for 30 days. 12/01/18 12/31/18  Long, Wonda Olds, MD  omeprazole (PRILOSEC) 40 MG capsule Take 40 mg by mouth daily.    [provider]      Allergies    Molds & smuts    Review of Systems   Review of Systems  Constitutional:  Negative for chills and fever.  HENT:  Negative for ear pain and sore throat.   Eyes:  Negative for pain and visual disturbance.  Respiratory:  Negative for cough and shortness of breath.   Cardiovascular:  Negative for chest pain and palpitations.  Gastrointestinal:  Negative for abdominal pain and vomiting.  Genitourinary:  Positive for flank pain (L>R). Negative for  difficulty urinating, dysuria, frequency, genital sores, hematuria, penile pain and penile swelling.  Musculoskeletal:  Negative for arthralgias and back pain.  Skin:  Negative for color change and rash.  Neurological:  Negative for seizures and syncope.  All other systems reviewed and are negative.   Physical Exam Updated Vital Signs BP 138/81 (BP Location: Left Arm)   Pulse 67   Temp (!) 97.5 F (36.4 C) (Oral)   Resp 20   Ht 5\' 7"  (1.702 m)   Wt 90.7 kg   SpO2 99%   BMI 31.32 kg/m  Physical Exam Vitals and nursing note reviewed.  Constitutional:      General: He is not in acute distress.    Appearance: He is well-developed.  HENT:     Head: Normocephalic and atraumatic.  Eyes:     Conjunctiva/sclera: Conjunctivae normal.  Cardiovascular:     Rate and Rhythm: Normal rate and regular rhythm.     Heart sounds: No murmur heard. Pulmonary:     Effort: Pulmonary effort is normal. No respiratory distress.     Breath sounds: Normal breath sounds.  Abdominal:     General: Abdomen is flat. There is no distension.     Palpations: Abdomen is soft. There is no mass.     Tenderness: There is no abdominal tenderness.  Musculoskeletal:        General: No swelling or tenderness. Normal  range of motion.     Cervical back: Neck supple.     Comments: Negative SLE  Negative FADIR/FABER bilaterally DP pulses intact  No bony tenderness over lumbar or thoracic spine   Skin:    General: Skin is warm and dry.     Capillary Refill: Capillary refill takes less than 2 seconds.  Neurological:     Mental Status: He is alert.  Psychiatric:        Mood and Affect: Mood normal.     ED Results / Procedures / Treatments   Labs (all labs ordered are listed, but only abnormal results are displayed) Labs Reviewed  URINALYSIS, ROUTINE W REFLEX MICROSCOPIC    EKG None  Radiology CT Renal Stone Study  Result Date: 11/26/2022 CLINICAL DATA:  Bilateral flank pain EXAM: CT ABDOMEN AND PELVIS  WITHOUT CONTRAST TECHNIQUE: Multidetector CT imaging of the abdomen and pelvis was performed following the standard protocol without IV contrast. RADIATION DOSE REDUCTION: This exam was performed according to the departmental dose-optimization program which includes automated exposure control, adjustment of the mA and/or kV according to patient size and/or use of iterative reconstruction technique. COMPARISON:  CT abdomen and pelvis dated July 09, 2022 FINDINGS: Lower chest: No acute abnormality. Hepatobiliary: Hepatic steatosis. No focal liver abnormality. Gallbladder is unremarkable. No biliary ductal dilation. Pancreas: Unremarkable. No pancreatic ductal dilatation or surrounding inflammatory changes. Spleen: Normal in size without focal abnormality. Adrenals/Urinary Tract: Bilateral adrenal glands are unremarkable. Prominent right extrarenal pelvis. No evidence of hydronephrosis. Nonobstructing right renal stones, largest is located in the lower pole the right kidney and measures 5 mm. Unchanged low-attenuation lesion of the upper pole of the right kidney, likely a simple cysts with no specific follow-up imaging recommended. Bladder is unremarkable. Stomach/Bowel: Stomach is within normal limits. Appendix appears normal. No evidence of bowel wall thickening, distention, or inflammatory changes. Vascular/Lymphatic: No significant vascular findings are present. No enlarged abdominal or pelvic lymph nodes. Reproductive: Prostate is unremarkable. Other: Small bilateral fat containing inguinal hernias. Small fat containing paraumbilical hernia. No abdominopelvic ascites. 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 Musculoskeletal: No acute or significant osseous findings. IMPRESSION: 1. No acute findings in the abdomen or pelvis, including no evidence of obstructive uropathy. 2. Nonobstructing right renal stones. 3. Hepatic  steatosis. Electronically Signed   By: Yetta Glassman M.D.   On: 11/26/2022 11:43    Procedures Procedures    Medications Ordered in ED Medications - No data to display  ED Course/ Medical Decision Making/ A&P                             Medical Decision Making Medical Decision Making:   TEDRIC LEETH is a 52 y.o. male who presented to the ED today with pain in the flank/back bilaterally with groin pain detailed above.    External chart has been reviewed including previous lithotripsy. Complete initial physical exam performed, notably the patient was to demonstrate leg raise, unremarkable abdominal exam, no CVA tenderness.    Reviewed and confirmed nursing documentation for past medical history, family history, social history.    Initial Assessment:   With the patient's presentation of back/flank pain, most likely diagnosis is MSK related versus renal stone pathology. Other diagnoses were considered including (but not limited to) intra-abdominal pathology, pyelonephritis, UTI, lower back strain. These are considered less likely due to history of present illness and  physical exam findings.   This is most consistent with an acute complicated illness  Initial Plan:   We will with a renal stone study to evaluate for stone blockage.  Urinalysis ordered to evaluate for UTI or relevant urologic/nephrologic pathology.  Objective evaluation as below reviewed   Initial Study Results:   Laboratory  All laboratory results reviewed without evidence of clinically relevant pathology.   CT renal stone and UA both negative   Radiology:  All images reviewed independently. Agree with radiology report at this time.   CT renal stone negative   Final Assessment and Plan:   Patient likely has musculoskeletal pain given presentation and ability to rule out renal etiology.  Patient is non-infectious, well appearing, hemodynamically stable prior to discharge  Continue with OTC analgesics and  regular stretching exercises at home  Return to PCP if symptoms persist  Clinical Impression: Back strain   Data Unavailable    Amount and/or Complexity of Data Reviewed Labs: ordered. Radiology: ordered.          Final Clinical Impression(s) / ED Diagnoses Final diagnoses:  Acute bilateral low back pain without sciatica    Rx / DC Orders ED Discharge Orders     None         Erskine Emery, MD 11/26/22 [place], [place], DO 11/26/22 1318

## 2022-11-26 NOTE — ED Notes (Signed)
Patient transported to CT
